# Patient Record
Sex: Female | Born: 1950 | Race: White | Hispanic: No | Smoking: Never smoker
Health system: Southern US, Community
[De-identification: ages and names within clinical notes are randomized; demographics above are authoritative.]

## PROBLEM LIST (undated history)

## (undated) ENCOUNTER — Ambulatory Visit (HOSPITAL_BASED_OUTPATIENT_CLINIC_OR_DEPARTMENT_OTHER)

## (undated) ENCOUNTER — Ambulatory Visit (HOSPITAL_BASED_OUTPATIENT_CLINIC_OR_DEPARTMENT_OTHER): Admission: EM | Source: Home / Self Care

## (undated) DIAGNOSIS — F419 Anxiety disorder, unspecified: Secondary | ICD-10-CM

## (undated) DIAGNOSIS — F329 Major depressive disorder, single episode, unspecified: Secondary | ICD-10-CM

## (undated) DIAGNOSIS — F32A Depression, unspecified: Secondary | ICD-10-CM

## (undated) DIAGNOSIS — R251 Tremor, unspecified: Secondary | ICD-10-CM

## (undated) DIAGNOSIS — G248 Other dystonia: Secondary | ICD-10-CM

## (undated) DIAGNOSIS — G43509 Persistent migraine aura without cerebral infarction, not intractable, without status migrainosus: Secondary | ICD-10-CM

## (undated) DIAGNOSIS — Z87820 Personal history of traumatic brain injury: Secondary | ICD-10-CM

## (undated) DIAGNOSIS — M810 Age-related osteoporosis without current pathological fracture: Secondary | ICD-10-CM

## (undated) DIAGNOSIS — I700 Atherosclerosis of aorta: Secondary | ICD-10-CM

## (undated) DIAGNOSIS — E559 Vitamin D deficiency, unspecified: Secondary | ICD-10-CM

## (undated) DIAGNOSIS — I82409 Acute embolism and thrombosis of unspecified deep veins of unspecified lower extremity: Secondary | ICD-10-CM

## (undated) DIAGNOSIS — T8859XA Other complications of anesthesia, initial encounter: Secondary | ICD-10-CM

## (undated) DIAGNOSIS — I639 Cerebral infarction, unspecified: Secondary | ICD-10-CM

## (undated) DIAGNOSIS — G20 Parkinson's disease: Secondary | ICD-10-CM

## (undated) DIAGNOSIS — T4145XA Adverse effect of unspecified anesthetic, initial encounter: Secondary | ICD-10-CM

## (undated) DIAGNOSIS — E785 Hyperlipidemia, unspecified: Secondary | ICD-10-CM

## (undated) DIAGNOSIS — I77810 Thoracic aortic ectasia: Secondary | ICD-10-CM

## (undated) DIAGNOSIS — G20A1 Parkinson's disease without dyskinesia, without mention of fluctuations: Secondary | ICD-10-CM

## (undated) DIAGNOSIS — K219 Gastro-esophageal reflux disease without esophagitis: Secondary | ICD-10-CM

## (undated) HISTORY — DX: Other dystonia: G24.8

## (undated) HISTORY — PX: NASAL SEPTUM SURGERY: SHX37

## (undated) HISTORY — PX: ESOPHAGEAL DILATION: SHX303

## (undated) HISTORY — PX: LAPAROSCOPIC CHOLECYSTECTOMY: SUR755

## (undated) HISTORY — DX: Age-related osteoporosis without current pathological fracture: M81.0

## (undated) HISTORY — PX: BACK SURGERY: SHX140

## (undated) HISTORY — DX: Vitamin D deficiency, unspecified: E55.9

## (undated) HISTORY — DX: Gastro-esophageal reflux disease without esophagitis: K21.9

## (undated) HISTORY — DX: Depression, unspecified: F32.A

## (undated) HISTORY — DX: Tremor, unspecified: R25.1

## (undated) HISTORY — PX: CARDIAC CATHETERIZATION: SHX172

## (undated) HISTORY — DX: Cerebral infarction, unspecified: I63.9

## (undated) HISTORY — DX: Atherosclerosis of aorta: I70.0

## (undated) HISTORY — DX: Thoracic aortic ectasia: I77.810

## (undated) HISTORY — PX: CATARACT EXTRACTION: SUR2

## (undated) HISTORY — DX: Personal history of traumatic brain injury: Z87.820

## (undated) HISTORY — DX: Major depressive disorder, single episode, unspecified: F32.9

## (undated) HISTORY — DX: Hyperlipidemia, unspecified: E78.5

## (undated) HISTORY — DX: Acute embolism and thrombosis of unspecified deep veins of unspecified lower extremity: I82.409

---

## 1986-04-29 HISTORY — PX: TUBAL LIGATION: SHX77

## 1992-10-19 ENCOUNTER — Encounter: Payer: Self-pay | Admitting: Internal Medicine

## 1992-12-20 ENCOUNTER — Encounter: Payer: Self-pay | Admitting: Internal Medicine

## 1993-04-29 HISTORY — PX: KNEE ARTHROSCOPY: SHX127

## 1997-07-21 ENCOUNTER — Other Ambulatory Visit: Admission: RE | Admit: 1997-07-21 | Discharge: 1997-07-21 | Payer: Self-pay | Admitting: Obstetrics and Gynecology

## 1998-07-27 ENCOUNTER — Other Ambulatory Visit: Admission: RE | Admit: 1998-07-27 | Discharge: 1998-07-27 | Payer: Self-pay | Admitting: Obstetrics and Gynecology

## 1998-08-09 ENCOUNTER — Other Ambulatory Visit: Admission: RE | Admit: 1998-08-09 | Discharge: 1998-08-09 | Payer: Self-pay | Admitting: Obstetrics and Gynecology

## 1998-10-18 ENCOUNTER — Other Ambulatory Visit: Admission: RE | Admit: 1998-10-18 | Discharge: 1998-10-18 | Payer: Self-pay | Admitting: Obstetrics and Gynecology

## 1999-04-02 ENCOUNTER — Other Ambulatory Visit: Admission: RE | Admit: 1999-04-02 | Discharge: 1999-04-02 | Payer: Self-pay | Admitting: Obstetrics and Gynecology

## 1999-04-03 ENCOUNTER — Other Ambulatory Visit: Admission: RE | Admit: 1999-04-03 | Discharge: 1999-04-03 | Payer: Self-pay | Admitting: Obstetrics and Gynecology

## 1999-04-03 ENCOUNTER — Encounter (INDEPENDENT_AMBULATORY_CARE_PROVIDER_SITE_OTHER): Payer: Self-pay | Admitting: Specialist

## 1999-04-19 ENCOUNTER — Encounter: Admission: RE | Admit: 1999-04-19 | Discharge: 1999-04-19 | Payer: Self-pay | Admitting: *Deleted

## 1999-04-19 ENCOUNTER — Encounter: Payer: Self-pay | Admitting: *Deleted

## 1999-04-25 ENCOUNTER — Encounter: Payer: Self-pay | Admitting: Neurosurgery

## 1999-04-26 ENCOUNTER — Inpatient Hospital Stay (HOSPITAL_COMMUNITY): Admission: RE | Admit: 1999-04-26 | Discharge: 1999-04-26 | Payer: Self-pay | Admitting: Neurosurgery

## 1999-04-26 ENCOUNTER — Encounter: Payer: Self-pay | Admitting: Neurosurgery

## 1999-05-17 ENCOUNTER — Encounter: Payer: Self-pay | Admitting: Neurosurgery

## 1999-05-17 ENCOUNTER — Encounter: Admission: RE | Admit: 1999-05-17 | Discharge: 1999-05-17 | Payer: Self-pay | Admitting: Neurosurgery

## 1999-05-24 ENCOUNTER — Encounter: Admission: RE | Admit: 1999-05-24 | Discharge: 1999-05-24 | Payer: Self-pay | Admitting: *Deleted

## 1999-05-24 ENCOUNTER — Encounter: Payer: Self-pay | Admitting: *Deleted

## 1999-05-29 ENCOUNTER — Encounter: Payer: Self-pay | Admitting: *Deleted

## 1999-05-29 ENCOUNTER — Encounter: Admission: RE | Admit: 1999-05-29 | Discharge: 1999-05-29 | Payer: Self-pay | Admitting: *Deleted

## 1999-07-17 ENCOUNTER — Encounter: Payer: Self-pay | Admitting: Neurology

## 1999-07-17 ENCOUNTER — Encounter: Admission: RE | Admit: 1999-07-17 | Discharge: 1999-07-17 | Payer: Self-pay | Admitting: Neurology

## 1999-07-24 ENCOUNTER — Encounter: Admission: RE | Admit: 1999-07-24 | Discharge: 1999-07-24 | Payer: Self-pay | Admitting: Neurosurgery

## 1999-07-24 ENCOUNTER — Encounter: Payer: Self-pay | Admitting: Neurosurgery

## 1999-08-15 ENCOUNTER — Encounter: Admission: RE | Admit: 1999-08-15 | Discharge: 1999-08-15 | Payer: Self-pay | Admitting: Obstetrics and Gynecology

## 1999-08-15 ENCOUNTER — Encounter: Payer: Self-pay | Admitting: Obstetrics and Gynecology

## 1999-10-02 ENCOUNTER — Other Ambulatory Visit: Admission: RE | Admit: 1999-10-02 | Discharge: 1999-10-02 | Payer: Self-pay | Admitting: Obstetrics and Gynecology

## 2000-01-22 ENCOUNTER — Encounter: Payer: Self-pay | Admitting: Neurosurgery

## 2000-01-22 ENCOUNTER — Encounter: Admission: RE | Admit: 2000-01-22 | Discharge: 2000-01-22 | Payer: Self-pay | Admitting: Neurosurgery

## 2000-07-01 ENCOUNTER — Encounter: Admission: RE | Admit: 2000-07-01 | Discharge: 2000-07-01 | Payer: Self-pay | Admitting: Neurosurgery

## 2000-07-01 ENCOUNTER — Encounter: Payer: Self-pay | Admitting: Neurosurgery

## 2000-08-14 ENCOUNTER — Encounter: Payer: Self-pay | Admitting: Neurosurgery

## 2000-08-14 ENCOUNTER — Encounter: Admission: RE | Admit: 2000-08-14 | Discharge: 2000-08-14 | Payer: Self-pay | Admitting: Neurosurgery

## 2000-10-29 ENCOUNTER — Other Ambulatory Visit: Admission: RE | Admit: 2000-10-29 | Discharge: 2000-10-29 | Payer: Self-pay | Admitting: Obstetrics and Gynecology

## 2000-11-19 ENCOUNTER — Encounter: Admission: RE | Admit: 2000-11-19 | Discharge: 2000-11-19 | Payer: Self-pay | Admitting: Obstetrics and Gynecology

## 2000-11-19 ENCOUNTER — Encounter: Payer: Self-pay | Admitting: Obstetrics and Gynecology

## 2001-03-29 HISTORY — PX: COMBINED HYSTEROSCOPY DIAGNOSTIC / D&C: SUR297

## 2001-04-20 ENCOUNTER — Ambulatory Visit (HOSPITAL_COMMUNITY): Admission: RE | Admit: 2001-04-20 | Discharge: 2001-04-20 | Payer: Self-pay | Admitting: Obstetrics and Gynecology

## 2001-04-20 ENCOUNTER — Encounter (INDEPENDENT_AMBULATORY_CARE_PROVIDER_SITE_OTHER): Payer: Self-pay

## 2001-05-27 ENCOUNTER — Encounter: Admission: RE | Admit: 2001-05-27 | Discharge: 2001-08-25 | Payer: Self-pay | Admitting: *Deleted

## 2001-11-23 ENCOUNTER — Encounter: Payer: Self-pay | Admitting: Obstetrics and Gynecology

## 2001-11-23 ENCOUNTER — Encounter: Admission: RE | Admit: 2001-11-23 | Discharge: 2001-11-23 | Payer: Self-pay | Admitting: Obstetrics and Gynecology

## 2001-11-25 ENCOUNTER — Other Ambulatory Visit: Admission: RE | Admit: 2001-11-25 | Discharge: 2001-11-25 | Payer: Self-pay | Admitting: Obstetrics and Gynecology

## 2002-07-09 ENCOUNTER — Encounter: Admission: RE | Admit: 2002-07-09 | Discharge: 2002-07-09 | Payer: Self-pay | Admitting: *Deleted

## 2002-07-09 ENCOUNTER — Encounter: Payer: Self-pay | Admitting: *Deleted

## 2002-10-29 ENCOUNTER — Encounter: Admission: RE | Admit: 2002-10-29 | Discharge: 2002-10-29 | Payer: Self-pay | Admitting: Emergency Medicine

## 2002-12-08 ENCOUNTER — Other Ambulatory Visit: Admission: RE | Admit: 2002-12-08 | Discharge: 2002-12-08 | Payer: Self-pay | Admitting: Obstetrics and Gynecology

## 2003-03-25 ENCOUNTER — Encounter: Admission: RE | Admit: 2003-03-25 | Discharge: 2003-03-25 | Payer: Self-pay | Admitting: Obstetrics and Gynecology

## 2003-06-30 ENCOUNTER — Other Ambulatory Visit: Admission: RE | Admit: 2003-06-30 | Discharge: 2003-06-30 | Payer: Self-pay | Admitting: Obstetrics and Gynecology

## 2004-01-05 ENCOUNTER — Other Ambulatory Visit: Admission: RE | Admit: 2004-01-05 | Discharge: 2004-01-05 | Payer: Self-pay | Admitting: Obstetrics and Gynecology

## 2004-02-02 ENCOUNTER — Encounter: Admission: RE | Admit: 2004-02-02 | Discharge: 2004-02-02 | Payer: Self-pay | Admitting: Endocrinology

## 2004-04-20 ENCOUNTER — Encounter: Admission: RE | Admit: 2004-04-20 | Discharge: 2004-04-20 | Payer: Self-pay | Admitting: Obstetrics and Gynecology

## 2004-04-29 HISTORY — PX: ANTERIOR CERVICAL DECOMP/DISCECTOMY FUSION: SHX1161

## 2005-02-01 ENCOUNTER — Other Ambulatory Visit: Admission: RE | Admit: 2005-02-01 | Discharge: 2005-02-01 | Payer: Self-pay | Admitting: Obstetrics and Gynecology

## 2005-04-26 ENCOUNTER — Encounter: Admission: RE | Admit: 2005-04-26 | Discharge: 2005-04-26 | Payer: Self-pay | Admitting: Obstetrics and Gynecology

## 2005-05-16 ENCOUNTER — Encounter: Admission: RE | Admit: 2005-05-16 | Discharge: 2005-05-16 | Payer: Self-pay | Admitting: Obstetrics and Gynecology

## 2005-12-12 ENCOUNTER — Encounter: Admission: RE | Admit: 2005-12-12 | Discharge: 2005-12-12 | Payer: Self-pay | Admitting: Obstetrics and Gynecology

## 2006-05-19 ENCOUNTER — Encounter: Admission: RE | Admit: 2006-05-19 | Discharge: 2006-05-19 | Payer: Self-pay | Admitting: *Deleted

## 2006-07-22 ENCOUNTER — Ambulatory Visit (HOSPITAL_COMMUNITY): Admission: RE | Admit: 2006-07-22 | Discharge: 2006-07-22 | Payer: Self-pay

## 2006-08-03 ENCOUNTER — Encounter: Admission: RE | Admit: 2006-08-03 | Discharge: 2006-08-03 | Payer: Self-pay

## 2006-08-14 ENCOUNTER — Encounter: Admission: RE | Admit: 2006-08-14 | Discharge: 2006-08-14 | Payer: Self-pay

## 2006-08-22 ENCOUNTER — Encounter: Admission: RE | Admit: 2006-08-22 | Discharge: 2006-08-22 | Payer: Self-pay | Admitting: Endocrinology

## 2007-03-31 ENCOUNTER — Encounter: Admission: RE | Admit: 2007-03-31 | Discharge: 2007-03-31 | Payer: Self-pay | Admitting: Internal Medicine

## 2007-04-13 ENCOUNTER — Encounter: Admission: RE | Admit: 2007-04-13 | Discharge: 2007-04-13 | Payer: Self-pay | Admitting: Internal Medicine

## 2007-06-17 ENCOUNTER — Encounter: Admission: RE | Admit: 2007-06-17 | Discharge: 2007-06-17 | Payer: Self-pay | Admitting: Obstetrics and Gynecology

## 2007-10-13 ENCOUNTER — Encounter: Payer: Self-pay | Admitting: Internal Medicine

## 2007-11-18 ENCOUNTER — Ambulatory Visit: Payer: Self-pay | Admitting: Internal Medicine

## 2007-11-18 DIAGNOSIS — I6350 Cerebral infarction due to unspecified occlusion or stenosis of unspecified cerebral artery: Secondary | ICD-10-CM | POA: Insufficient documentation

## 2007-11-18 DIAGNOSIS — G4730 Sleep apnea, unspecified: Secondary | ICD-10-CM | POA: Insufficient documentation

## 2007-11-18 DIAGNOSIS — R51 Headache: Secondary | ICD-10-CM | POA: Insufficient documentation

## 2007-11-18 DIAGNOSIS — E785 Hyperlipidemia, unspecified: Secondary | ICD-10-CM | POA: Insufficient documentation

## 2007-11-18 DIAGNOSIS — R519 Headache, unspecified: Secondary | ICD-10-CM | POA: Insufficient documentation

## 2007-11-25 ENCOUNTER — Telehealth (INDEPENDENT_AMBULATORY_CARE_PROVIDER_SITE_OTHER): Payer: Self-pay | Admitting: *Deleted

## 2007-12-01 ENCOUNTER — Encounter: Payer: Self-pay | Admitting: Internal Medicine

## 2007-12-02 ENCOUNTER — Ambulatory Visit: Payer: Self-pay | Admitting: Internal Medicine

## 2007-12-02 ENCOUNTER — Telehealth (INDEPENDENT_AMBULATORY_CARE_PROVIDER_SITE_OTHER): Payer: Self-pay | Admitting: *Deleted

## 2007-12-18 ENCOUNTER — Encounter: Payer: Self-pay | Admitting: Internal Medicine

## 2008-01-24 ENCOUNTER — Encounter: Admission: RE | Admit: 2008-01-24 | Discharge: 2008-01-24 | Payer: Self-pay | Admitting: Internal Medicine

## 2008-01-26 ENCOUNTER — Ambulatory Visit: Payer: Self-pay | Admitting: Internal Medicine

## 2008-02-06 DIAGNOSIS — J309 Allergic rhinitis, unspecified: Secondary | ICD-10-CM | POA: Insufficient documentation

## 2008-03-13 ENCOUNTER — Encounter: Payer: Self-pay | Admitting: Internal Medicine

## 2008-08-01 ENCOUNTER — Encounter: Admission: RE | Admit: 2008-08-01 | Discharge: 2008-08-01 | Payer: Self-pay | Admitting: Obstetrics and Gynecology

## 2009-01-06 ENCOUNTER — Encounter: Admission: RE | Admit: 2009-01-06 | Discharge: 2009-01-06 | Payer: Self-pay | Admitting: Obstetrics and Gynecology

## 2009-08-21 ENCOUNTER — Encounter: Admission: RE | Admit: 2009-08-21 | Discharge: 2009-08-21 | Payer: Self-pay | Admitting: Obstetrics and Gynecology

## 2010-05-19 ENCOUNTER — Encounter: Payer: Self-pay | Admitting: Obstetrics and Gynecology

## 2010-05-20 ENCOUNTER — Encounter: Payer: Self-pay | Admitting: Obstetrics and Gynecology

## 2010-05-20 ENCOUNTER — Encounter: Payer: Self-pay | Admitting: Endocrinology

## 2010-07-12 ENCOUNTER — Other Ambulatory Visit: Payer: Self-pay | Admitting: Obstetrics and Gynecology

## 2010-07-12 DIAGNOSIS — Z1231 Encounter for screening mammogram for malignant neoplasm of breast: Secondary | ICD-10-CM

## 2010-09-07 ENCOUNTER — Ambulatory Visit
Admission: RE | Admit: 2010-09-07 | Discharge: 2010-09-07 | Disposition: A | Discharge status: Home / Self Care | Payer: BC Managed Care – PPO | Source: Ambulatory Visit | Attending: Obstetrics and Gynecology | Admitting: Obstetrics and Gynecology

## 2010-09-07 DIAGNOSIS — Z1231 Encounter for screening mammogram for malignant neoplasm of breast: Secondary | ICD-10-CM

## 2010-09-14 NOTE — H&P (Signed)
[facility]  Patient:    Barbara Miranda Visit Number: [id number] MRN: [id number]          Service Type: Attending:  [name] Salvia. Marcelle Overlie, M.D. Dictated by:   [name] Salvia. Marcelle Overlie, M.D. Adm. Date:  04/20/01                           History and Physical  CHIEF COMPLAINT:  Menorrhagia, abnormal uterine bleeding.  HISTORY OF PRESENT ILLNESS:  A 60 year old G3, P3, previous tubal ligation. This patient has had a six-month history of menorrhagia and some irregular bleeding.  Ultrasound done in the office November 16, 2000, showed some irregular polypoid areas within the cavity on saline infusion.  She presents now for D&C/hysteroscopy.  This procedure, including risks of bleeding, infection, the possible need for open or additional surgery, are reviewed with her, which she understands and accepts.  ALLERGIES:  None.  PAST SURGICAL HISTORY:  Tubal ligation.  MEDICATIONS:  Adderall, calcium, vitamins, atenolol.  PHYSICAL EXAMINATION:  VITAL SIGNS:  Temperature 98.2, blood pressure 106/60.  HEENT:  Unremarkable.  NECK:  Supple without mass.  CHEST:  Lungs clear.  CARDIAC:  Regular rate and rhythm without murmurs, rubs, gallops noted.  BREASTS:  Without masses.  ABDOMEN:  Soft, flat, and nontender.  PELVIC:  Normal external genitalia.  Vagina and cervix clear.  Uterus midposition, normal size.  Adnexa negative.  IMPRESSION:  Abnormal uterine bleeding, menorrhagia.  Endometrial polyps noted on sonohysterogram.  PLAN:  D&C, hysteroscopy.  Procedure and risks reviewed as above. Dictated by:   [name] Salvia. Marcelle Overlie, M.D. Attending:  [name] Salvia. Marcelle Overlie, M.D. DD:  04/13/01 TD:  04/13/01 Job: [id number]

## 2010-09-14 NOTE — Op Note (Signed)
[facility]  Patient:    Barbara Miranda, Barbara Miranda Visit Number: [id number] MRN: [id number]          Service Type: DSU Location: [facility] Attending Physician:  Rhina Brackett Dictated by:   [name] Salvia. Marcelle Overlie, M.D. Proc. Date: 04/20/01 Admit Date:  04/20/2001                             Operative Report  PREOPERATIVE DIAGNOSES:       1. Abnormal uterine bleeding.                               2. Endometrial polyps.  POSTOPERATIVE DIAGNOSES:      1. Abnormal uterine bleeding.                               2. Endometrial polyps.  PROCEDURE:                    Hysteroscopy, dilation and curettage.  SURGEON:                      [name] Salvia. Marcelle Overlie, M.D.  ANESTHESIA:                   Sedation plus paracervical block.  PROCEDURE AND FINDINGS:       The patient was taken to the operating room. After an adequate level of sedation was obtained with the patients legs in stirrups, the perineum and vagina were prepped and draped in the usual manner for vaginal procedures.  The bladder was drained and EUA carried out.  The uterus was midposition, normal size and mobile.  Adnexa negative.  A speculum was positioned.  The cervix was grasped with a tenaculum.  Paracervical block was created by infiltrating at 3 and 9 oclock submucosally 5-7 cc of 1% Xylocaine on each side after negative aspiration.  After this was completed, the uterus was sounded to 10 cm and progressively dilated to #29 Shawnie Pons.  The continuous flow 7 mm diagnostic hysteroscope was then used to inspect the cavity.  What appeared to be two large polyps were noted.  The scope was removed.  D&C was carried out.  A large amount of tissue including some polypoid tissue was removed.  When the walls were felt to be clean, the scope was reinserted.  The cavity was irrigated and reinspected and noted to be normal.  There were no other remaining polyps or abnormalities.  She tolerated this well and went to  the recovery room in good condition. Dictated by:   [name] Salvia. Marcelle Overlie, M.D. Attending Physician:  Rhina Brackett DD:  04/20/01 TD:  04/20/01 Job: [id number]

## 2010-09-14 NOTE — Procedures (Signed)
EEG NUMBER:   EEG recording was performed as a routine study.  The patient was  ambulatory.  The patient is described as left-handed, awake and during  the EEG, partially drowsy.  Activating procedures for this 16 channel  EEG recording was 1 channel representing heart rate and rhythm  exclusively were including hyperventilation and photic stimulation.   Amantadine Adderall, Inderal, Zoloft, aspirin, and Midrin were named as  medications.  No allergies were noted.  The patient is a 60 year old  left handed individual with the with a history of temporal lobe  seizures.   DESCRIPTION:  A posterior dominant background of 8 Hz is seen emitting  symmetrically from the posterior hemispheres promptly attenuating with  eye opening.  During the recording, these posterior dominant rhythms  resume sharp wave formation.  These pointed spike-like discharges are  especially seen at the end of hyperventilation.  Also, a seizure was  clinically not witnessed.  The degree of amplitude buildup and the phase  reversal over both parietal hemispheres would indicate to me dysrhythmia  grade 1.  During photic stimulation, there was photic entrainment from 5  Hz frequencies onward.  Again, no clinical seizure resulted.  Also, the  patient is described as having a history of temporal lobe seizures.  I  found phase reversals repeatedly occurring over the parietal and not  necessarily the temporal lobe regions.  The patient reached drowsiness  after the completion of both provocation maneuvers but did not enter  sleep stage I.   CONCLUSION:  This is an abnormal EEG due to the phase reversal over the  parietal regions with both provocation maneuvers.  Hyperventilation as  well as photic stimulation indicate an underlying irritable focus.  The  EKG remained in normal sinus rhythm.      Melvyn Novas, M.D.  Electronically Signed     [id number]  D:  07/22/2006 15:37:48  T:  07/22/2006 16:58:41  Job #:   [id number]   cc:   Dr. Eulogio Bear

## 2011-01-10 ENCOUNTER — Other Ambulatory Visit: Payer: Self-pay | Admitting: Internal Medicine

## 2011-01-10 DIAGNOSIS — G43909 Migraine, unspecified, not intractable, without status migrainosus: Secondary | ICD-10-CM

## 2011-08-02 ENCOUNTER — Other Ambulatory Visit: Payer: Self-pay | Admitting: Obstetrics and Gynecology

## 2011-08-02 DIAGNOSIS — Z1231 Encounter for screening mammogram for malignant neoplasm of breast: Secondary | ICD-10-CM

## 2011-09-09 ENCOUNTER — Ambulatory Visit
Admission: RE | Admit: 2011-09-09 | Discharge: 2011-09-09 | Disposition: A | Discharge status: Home / Self Care | Payer: BC Managed Care – PPO | Source: Ambulatory Visit | Attending: Obstetrics and Gynecology | Admitting: Obstetrics and Gynecology

## 2011-09-09 ENCOUNTER — Ambulatory Visit: Payer: BC Managed Care – PPO

## 2011-09-09 DIAGNOSIS — Z1231 Encounter for screening mammogram for malignant neoplasm of breast: Secondary | ICD-10-CM

## 2012-08-04 ENCOUNTER — Other Ambulatory Visit: Payer: Self-pay

## 2012-08-04 DIAGNOSIS — Z1231 Encounter for screening mammogram for malignant neoplasm of breast: Secondary | ICD-10-CM

## 2012-09-09 ENCOUNTER — Ambulatory Visit
Admission: RE | Admit: 2012-09-09 | Discharge: 2012-09-09 | Disposition: A | Discharge status: Home / Self Care | Payer: BC Managed Care – PPO | Source: Ambulatory Visit

## 2012-09-09 DIAGNOSIS — Z1231 Encounter for screening mammogram for malignant neoplasm of breast: Secondary | ICD-10-CM

## 2013-01-09 DIAGNOSIS — R251 Tremor, unspecified: Secondary | ICD-10-CM | POA: Insufficient documentation

## 2013-02-01 ENCOUNTER — Other Ambulatory Visit: Payer: Self-pay | Admitting: Obstetrics and Gynecology

## 2013-02-01 DIAGNOSIS — N644 Mastodynia: Secondary | ICD-10-CM

## 2013-02-01 DIAGNOSIS — N6001 Solitary cyst of right breast: Secondary | ICD-10-CM

## 2013-02-04 ENCOUNTER — Ambulatory Visit
Admission: RE | Admit: 2013-02-04 | Discharge: 2013-02-04 | Disposition: A | Discharge status: Home / Self Care | Payer: BC Managed Care – PPO | Source: Ambulatory Visit | Attending: Obstetrics and Gynecology | Admitting: Obstetrics and Gynecology

## 2013-02-04 DIAGNOSIS — N644 Mastodynia: Secondary | ICD-10-CM

## 2013-02-04 DIAGNOSIS — N6001 Solitary cyst of right breast: Secondary | ICD-10-CM

## 2013-08-09 ENCOUNTER — Other Ambulatory Visit: Payer: Self-pay

## 2013-08-09 DIAGNOSIS — Z1231 Encounter for screening mammogram for malignant neoplasm of breast: Secondary | ICD-10-CM

## 2013-09-10 ENCOUNTER — Ambulatory Visit: Payer: BC Managed Care – PPO

## 2013-09-15 ENCOUNTER — Ambulatory Visit
Admission: RE | Admit: 2013-09-15 | Discharge: 2013-09-15 | Disposition: A | Discharge status: Home / Self Care | Payer: BC Managed Care – PPO | Source: Ambulatory Visit

## 2013-09-15 DIAGNOSIS — Z1231 Encounter for screening mammogram for malignant neoplasm of breast: Secondary | ICD-10-CM

## 2014-06-03 ENCOUNTER — Other Ambulatory Visit: Payer: Self-pay | Admitting: Obstetrics and Gynecology

## 2014-06-03 DIAGNOSIS — R234 Changes in skin texture: Secondary | ICD-10-CM

## 2014-06-10 ENCOUNTER — Ambulatory Visit
Admission: RE | Admit: 2014-06-10 | Discharge: 2014-06-10 | Disposition: A | Discharge status: Home / Self Care | Payer: BC Managed Care – PPO | Source: Ambulatory Visit | Attending: Obstetrics and Gynecology | Admitting: Obstetrics and Gynecology

## 2014-06-10 DIAGNOSIS — R234 Changes in skin texture: Secondary | ICD-10-CM

## 2014-07-07 DIAGNOSIS — G20 Parkinson's disease: Secondary | ICD-10-CM | POA: Insufficient documentation

## 2014-07-07 DIAGNOSIS — K224 Dyskinesia of esophagus: Secondary | ICD-10-CM | POA: Insufficient documentation

## 2014-09-05 ENCOUNTER — Other Ambulatory Visit: Payer: Self-pay

## 2014-09-05 DIAGNOSIS — Z1231 Encounter for screening mammogram for malignant neoplasm of breast: Secondary | ICD-10-CM

## 2014-09-19 ENCOUNTER — Ambulatory Visit
Admission: RE | Admit: 2014-09-19 | Discharge: 2014-09-19 | Disposition: A | Discharge status: Home / Self Care | Payer: BC Managed Care – PPO | Source: Ambulatory Visit

## 2014-09-19 DIAGNOSIS — Z1231 Encounter for screening mammogram for malignant neoplasm of breast: Secondary | ICD-10-CM

## 2014-09-21 ENCOUNTER — Other Ambulatory Visit: Payer: Self-pay | Admitting: Obstetrics and Gynecology

## 2014-09-22 LAB — CYTOLOGY - PAP

## 2014-10-25 ENCOUNTER — Other Ambulatory Visit: Payer: Self-pay | Admitting: Obstetrics and Gynecology

## 2014-12-13 ENCOUNTER — Telehealth: Payer: Self-pay | Admitting: Neurology

## 2014-12-13 ENCOUNTER — Ambulatory Visit (INDEPENDENT_AMBULATORY_CARE_PROVIDER_SITE_OTHER): Payer: BC Managed Care – PPO | Admitting: Neurology

## 2014-12-13 ENCOUNTER — Encounter: Payer: Self-pay | Admitting: Neurology

## 2014-12-13 VITALS — BP 134/90 | HR 66 | Ht 61.5 in | Wt 158.0 lb

## 2014-12-13 DIAGNOSIS — G20 Parkinson's disease: Secondary | ICD-10-CM | POA: Diagnosis not present

## 2014-12-13 MED ORDER — PRAMIPEXOLE DIHYDROCHLORIDE 0.125 MG PO TABS: ORAL_TABLET | ORAL | Status: DC

## 2014-12-13 MED ORDER — PRAMIPEXOLE DIHYDROCHLORIDE 0.5 MG PO TABS: 0.5000 mg | ORAL_TABLET | Freq: Three times a day (TID) | ORAL | Status: DC

## 2014-12-13 NOTE — Telephone Encounter (Signed)
Patient spoke with her daughter who confirmed that she was told by Dr [name] that she did have Parkinson's Disease, not just tremor. She wanted to make you aware. She will call with any other questions.

## 2014-12-13 NOTE — Telephone Encounter (Signed)
No problem.

## 2014-12-13 NOTE — Patient Instructions (Signed)
1. Continue Topamax.  2. Start mirapex (pramipexole) as follows:  0.125 mg - 1 tablet three times per day for a week, then 2 tablets three times per day for a week and then fill the 0.5 mg tablet and take that, 1 pill three times per day 3. [facility] [address] of [address] [place], [place], [place] [address] Meeting Location Address: [address] Support Group Leader 1 Name: Rochele Raring, [place] Support Group Leader 1 E-mail: [email or web address] Support Group Leader 1 Phone: [phone] Typical Meeting Time: 10:30 a.m. Frequency of Meetings: Monthly Status of Group: Active E-mail: 1st Tuesday

## 2014-12-13 NOTE — Progress Notes (Signed)
Note routed to Dr [name]. 

## 2014-12-13 NOTE — Telephone Encounter (Signed)
Pt called and said she had a question about her appointment with Dr Tat this morning/Dawn CB# [phone]
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
## 2014-12-20 ENCOUNTER — Other Ambulatory Visit: Payer: Self-pay | Admitting: Neurology

## 2015-01-19 ENCOUNTER — Emergency Department (HOSPITAL_COMMUNITY): Payer: BC Managed Care – PPO

## 2015-01-19 ENCOUNTER — Encounter (HOSPITAL_COMMUNITY): Payer: Self-pay

## 2015-01-19 ENCOUNTER — Inpatient Hospital Stay (HOSPITAL_COMMUNITY)
Admission: EM | Admit: 2015-01-19 | Discharge: 2015-01-24 | DRG: 493 | Disposition: A | Discharge status: Home Health | Payer: BC Managed Care – PPO | Attending: General Surgery | Admitting: General Surgery

## 2015-01-19 DIAGNOSIS — S8255XA Nondisplaced fracture of medial malleolus of left tibia, initial encounter for closed fracture: Principal | ICD-10-CM | POA: Diagnosis present

## 2015-01-19 DIAGNOSIS — E785 Hyperlipidemia, unspecified: Secondary | ICD-10-CM | POA: Diagnosis present

## 2015-01-19 DIAGNOSIS — Z6828 Body mass index (BMI) 28.0-28.9, adult: Secondary | ICD-10-CM

## 2015-01-19 DIAGNOSIS — Z8673 Personal history of transient ischemic attack (TIA), and cerebral infarction without residual deficits: Secondary | ICD-10-CM

## 2015-01-19 DIAGNOSIS — S2611XA Contusion of heart without hemopericardium, initial encounter: Secondary | ICD-10-CM | POA: Diagnosis present

## 2015-01-19 DIAGNOSIS — S023XXA Fracture of orbital floor, initial encounter for closed fracture: Secondary | ICD-10-CM | POA: Diagnosis present

## 2015-01-19 DIAGNOSIS — S02412A LeFort II fracture, initial encounter for closed fracture: Secondary | ICD-10-CM | POA: Diagnosis present

## 2015-01-19 DIAGNOSIS — F329 Major depressive disorder, single episode, unspecified: Secondary | ICD-10-CM | POA: Diagnosis present

## 2015-01-19 DIAGNOSIS — M25572 Pain in left ankle and joints of left foot: Secondary | ICD-10-CM | POA: Diagnosis present

## 2015-01-19 DIAGNOSIS — S8252XA Displaced fracture of medial malleolus of left tibia, initial encounter for closed fracture: Secondary | ICD-10-CM

## 2015-01-19 DIAGNOSIS — S2239XA Fracture of one rib, unspecified side, initial encounter for closed fracture: Secondary | ICD-10-CM | POA: Diagnosis present

## 2015-01-19 DIAGNOSIS — T148XXA Other injury of unspecified body region, initial encounter: Secondary | ICD-10-CM

## 2015-01-19 DIAGNOSIS — S02402A Zygomatic fracture, unspecified, initial encounter for closed fracture: Secondary | ICD-10-CM | POA: Diagnosis present

## 2015-01-19 DIAGNOSIS — T148 Other injury of unspecified body region: Secondary | ICD-10-CM | POA: Diagnosis present

## 2015-01-19 DIAGNOSIS — E669 Obesity, unspecified: Secondary | ICD-10-CM | POA: Diagnosis present

## 2015-01-19 DIAGNOSIS — S20219A Contusion of unspecified front wall of thorax, initial encounter: Secondary | ICD-10-CM | POA: Diagnosis present

## 2015-01-19 DIAGNOSIS — R55 Syncope and collapse: Secondary | ICD-10-CM

## 2015-01-19 DIAGNOSIS — F419 Anxiety disorder, unspecified: Secondary | ICD-10-CM | POA: Diagnosis present

## 2015-01-19 DIAGNOSIS — I444 Left anterior fascicular block: Secondary | ICD-10-CM | POA: Diagnosis not present

## 2015-01-19 DIAGNOSIS — R748 Abnormal levels of other serum enzymes: Secondary | ICD-10-CM | POA: Diagnosis not present

## 2015-01-19 DIAGNOSIS — E559 Vitamin D deficiency, unspecified: Secondary | ICD-10-CM | POA: Diagnosis present

## 2015-01-19 DIAGNOSIS — G20 Parkinson's disease: Secondary | ICD-10-CM | POA: Diagnosis present

## 2015-01-19 DIAGNOSIS — S022XXA Fracture of nasal bones, initial encounter for closed fracture: Secondary | ICD-10-CM | POA: Diagnosis present

## 2015-01-19 DIAGNOSIS — K219 Gastro-esophageal reflux disease without esophagitis: Secondary | ICD-10-CM | POA: Diagnosis present

## 2015-01-19 DIAGNOSIS — D62 Acute posthemorrhagic anemia: Secondary | ICD-10-CM | POA: Diagnosis present

## 2015-01-19 DIAGNOSIS — G4730 Sleep apnea, unspecified: Secondary | ICD-10-CM | POA: Diagnosis present

## 2015-01-19 DIAGNOSIS — S8253XA Displaced fracture of medial malleolus of unspecified tibia, initial encounter for closed fracture: Secondary | ICD-10-CM | POA: Diagnosis present

## 2015-01-19 DIAGNOSIS — T07XXXA Unspecified multiple injuries, initial encounter: Secondary | ICD-10-CM

## 2015-01-19 DIAGNOSIS — S0292XA Unspecified fracture of facial bones, initial encounter for closed fracture: Secondary | ICD-10-CM

## 2015-01-19 HISTORY — DX: Adverse effect of unspecified anesthetic, initial encounter: T41.45XA

## 2015-01-19 HISTORY — DX: Parkinson's disease without dyskinesia, without mention of fluctuations: G20.A1

## 2015-01-19 HISTORY — DX: Parkinson's disease: G20

## 2015-01-19 HISTORY — DX: Other complications of anesthesia, initial encounter: T88.59XA

## 2015-01-19 HISTORY — DX: Persistent migraine aura without cerebral infarction, not intractable, without status migrainosus: G43.509

## 2015-01-19 HISTORY — DX: Anxiety disorder, unspecified: F41.9

## 2015-01-19 LAB — CBC
HEMATOCRIT: 40.3 % (ref 36.0–46.0)
HEMOGLOBIN: 13.1 g/dL (ref 12.0–15.0)
MCH: 28.9 pg (ref 26.0–34.0)
MCHC: 32.5 g/dL (ref 30.0–36.0)
MCV: 88.8 fL (ref 78.0–100.0)
Platelets: 183 10*3/uL (ref 150–400)
RBC: 4.54 MIL/uL (ref 3.87–5.11)
RDW: 14.1 % (ref 11.5–15.5)
WBC: 11.2 10*3/uL — ABNORMAL HIGH (ref 4.0–10.5)

## 2015-01-19 LAB — COMPREHENSIVE METABOLIC PANEL
ALBUMIN: 3.7 g/dL (ref 3.5–5.0)
ALT: 105 U/L — ABNORMAL HIGH (ref 14–54)
ANION GAP: 10 (ref 5–15)
AST: 271 U/L — ABNORMAL HIGH (ref 15–41)
Alkaline Phosphatase: 68 U/L (ref 38–126)
BUN: 12 mg/dL (ref 6–20)
CHLORIDE: 108 mmol/L (ref 101–111)
CO2: 21 mmol/L — ABNORMAL LOW (ref 22–32)
Calcium: 9 mg/dL (ref 8.9–10.3)
Creatinine, Ser: 0.8 mg/dL (ref 0.44–1.00)
GFR calc Af Amer: >60 mL/min (ref >60)
GFR calc non Af Amer: >60 mL/min (ref >60)
GLUCOSE: 164 mg/dL — AB (ref 65–99)
POTASSIUM: 3.4 mmol/L — AB (ref 3.5–5.1)
Sodium: 139 mmol/L (ref 135–145)
TOTAL PROTEIN: 6.2 g/dL — AB (ref 6.5–8.1)
Total Bilirubin: 0.4 mg/dL (ref 0.3–1.2)

## 2015-01-19 LAB — I-STAT CHEM 8, ED
BUN: 14 mg/dL (ref 6–20)
CALCIUM ION: 1.18 mmol/L (ref 1.13–1.30)
CHLORIDE: 106 mmol/L (ref 101–111)
Creatinine, Ser: 0.7 mg/dL (ref 0.44–1.00)
Glucose, Bld: 159 mg/dL — ABNORMAL HIGH (ref 65–99)
HCT: 43 % (ref 36.0–46.0)
Hemoglobin: 14.6 g/dL (ref 12.0–15.0)
Potassium: 3.4 mmol/L — ABNORMAL LOW (ref 3.5–5.1)
SODIUM: 140 mmol/L (ref 135–145)
TCO2: 21 mmol/L (ref 0–100)

## 2015-01-19 LAB — SAMPLE TO BLOOD BANK

## 2015-01-19 LAB — I-STAT TROPONIN, ED: TROPONIN I, POC: 0.2 ng/mL — AB (ref 0.00–0.08)

## 2015-01-19 LAB — PROTIME-INR
INR: 1.13 (ref 0.00–1.49)
PROTHROMBIN TIME: 14.7 s (ref 11.6–15.2)

## 2015-01-19 LAB — ETHANOL: Alcohol, Ethyl (B): <5 mg/dL (ref <5)

## 2015-01-19 LAB — CDS SEROLOGY

## 2015-01-19 MED ORDER — SILVER NITRATE-POT NITRATE 75-25 % EX MISC
10.0000 | Freq: Once | CUTANEOUS | Status: DC | PRN
  Filled 2015-01-19: qty 10

## 2015-01-19 MED ORDER — MORPHINE SULFATE (PF) 4 MG/ML IV SOLN
4.0000 mg | Freq: Once | INTRAVENOUS | Status: AC
  Administered 2015-01-19: 4 mg via INTRAVENOUS

## 2015-01-19 MED ORDER — IOHEXOL 300 MG/ML  SOLN
100.0000 mL | Freq: Once | INTRAMUSCULAR | Status: AC | PRN
  Administered 2015-01-19: 100 mL via INTRAVENOUS

## 2015-01-19 MED ORDER — LIDOCAINE HCL 4 % EX SOLN: 0.0000 mL | Freq: Once | CUTANEOUS | Status: DC | PRN

## 2015-01-19 MED ORDER — LIDOCAINE-EPINEPHRINE 1 %-1:100000 IJ SOLN
0.0000 mL | Freq: Once | INTRAMUSCULAR | Status: DC | PRN
  Filled 2015-01-19: qty 30

## 2015-01-19 MED ORDER — ONDANSETRON HCL 4 MG/2ML IJ SOLN
4.0000 mg | Freq: Once | INTRAMUSCULAR | Status: AC
  Administered 2015-01-19: 4 mg via INTRAVENOUS
  Filled 2015-01-19: qty 2

## 2015-01-19 MED ORDER — OXYCODONE HCL 5 MG PO TABS
10.0000 mg | ORAL_TABLET | ORAL | Status: DC | PRN
  Administered 2015-01-20 (×4): 10 mg via ORAL
  Administered 2015-01-20: 5 mg via ORAL
  Administered 2015-01-21 – 2015-01-23 (×8): 10 mg via ORAL
  Filled 2015-01-19 (×13): qty 2

## 2015-01-19 MED ORDER — PANTOPRAZOLE SODIUM 40 MG PO TBEC
40.0000 mg | DELAYED_RELEASE_TABLET | Freq: Every day | ORAL | Status: DC
  Administered 2015-01-20 – 2015-01-24 (×5): 40 mg via ORAL
  Filled 2015-01-19 (×5): qty 1

## 2015-01-19 MED ORDER — OXYMETAZOLINE HCL 0.05 % NA SOLN: 1.0000 | Freq: Once | NASAL | Status: DC | PRN

## 2015-01-19 MED ORDER — FLUOXETINE HCL 20 MG PO CAPS
40.0000 mg | ORAL_CAPSULE | Freq: Every day | ORAL | Status: DC
  Administered 2015-01-20 – 2015-01-24 (×5): 40 mg via ORAL
  Filled 2015-01-19 (×7): qty 2

## 2015-01-19 MED ORDER — MORPHINE SULFATE (PF) 4 MG/ML IV SOLN
4.0000 mg | Freq: Once | INTRAVENOUS | Status: DC
  Filled 2015-01-19: qty 1

## 2015-01-19 MED ORDER — TRIPLE ANTIBIOTIC 3.5-400-5000 EX OINT
1.0000 "application " | TOPICAL_OINTMENT | Freq: Once | CUTANEOUS | Status: AC | PRN
  Administered 2015-01-19: 1 via CUTANEOUS
  Filled 2015-01-19 (×2): qty 1

## 2015-01-19 MED ORDER — ACETAMINOPHEN 325 MG PO TABS: 325.0000 mg | ORAL_TABLET | Freq: Four times a day (QID) | ORAL | Status: DC | PRN

## 2015-01-19 MED ORDER — OXYCODONE HCL 5 MG PO TABS
5.0000 mg | ORAL_TABLET | ORAL | Status: DC | PRN
  Administered 2015-01-19 – 2015-01-23 (×4): 5 mg via ORAL
  Filled 2015-01-19 (×5): qty 1

## 2015-01-19 MED ORDER — ONDANSETRON HCL 4 MG PO TABS: 4.0000 mg | ORAL_TABLET | Freq: Four times a day (QID) | ORAL | Status: DC | PRN

## 2015-01-19 MED ORDER — TOPIRAMATE 100 MG PO TABS
100.0000 mg | ORAL_TABLET | Freq: Every evening | ORAL | Status: DC
  Administered 2015-01-20 – 2015-01-23 (×5): 100 mg via ORAL
  Filled 2015-01-19 (×6): qty 1

## 2015-01-19 MED ORDER — MORPHINE SULFATE (PF) 2 MG/ML IV SOLN
2.0000 mg | INTRAVENOUS | Status: DC | PRN
  Administered 2015-01-23: 2 mg via INTRAVENOUS
  Filled 2015-01-19: qty 1

## 2015-01-19 MED ORDER — LIDOCAINE HCL 2 % EX GEL
1.0000 "application " | Freq: Once | CUTANEOUS | Status: DC | PRN
  Filled 2015-01-19: qty 5

## 2015-01-19 MED ORDER — ENOXAPARIN SODIUM 40 MG/0.4ML ~~LOC~~ SOLN
40.0000 mg | SUBCUTANEOUS | Status: DC
  Administered 2015-01-20 – 2015-01-24 (×5): 40 mg via SUBCUTANEOUS
  Filled 2015-01-19 (×4): qty 0.4

## 2015-01-19 MED ORDER — TOPIRAMATE 25 MG PO TABS
50.0000 mg | ORAL_TABLET | Freq: Every morning | ORAL | Status: DC
  Administered 2015-01-20 – 2015-01-24 (×5): 50 mg via ORAL
  Filled 2015-01-19 (×5): qty 2

## 2015-01-19 MED ORDER — ONDANSETRON HCL 4 MG/2ML IJ SOLN: 4.0000 mg | Freq: Four times a day (QID) | INTRAMUSCULAR | Status: DC | PRN

## 2015-01-19 MED ORDER — SODIUM CHLORIDE 0.9 % IV BOLUS (SEPSIS)
1000.0000 mL | Freq: Once | INTRAVENOUS | Status: AC
  Administered 2015-01-19: 1000 mL via INTRAVENOUS

## 2015-01-19 MED ORDER — POTASSIUM CHLORIDE IN NACL 20-0.9 MEQ/L-% IV SOLN
INTRAVENOUS | Status: DC
  Administered 2015-01-19: 19:00:00 via INTRAVENOUS
  Filled 2015-01-19 (×2): qty 1000
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
## 2015-01-19 NOTE — Consult Note (Signed)
Patient will be fully evaluated tomorrow for her left medial malleolus fracture.  Splint/ Cam boot has been ordered.  Syncopal work up is in progress.  If cleared by Monday would anticipate repair at that time. Full consult to follow in the am.  Myrene Galas, MD [facility] [phone] (p)

## 2015-01-19 NOTE — ED Notes (Signed)
PA made aware of pt blood pressures.
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
## 2015-01-19 NOTE — Progress Notes (Signed)
Orthopedic Tech Progress Note Patient Details:  Barbara Miranda 07-May-1950 [id number]  Ortho Devices Type of Ortho Device: CAM walker Ortho Device/Splint Location: lle Ortho Device/Splint Interventions: Application   Crawford, Rembert 01/19/2015, 4:02 PM
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
## 2015-01-20 ENCOUNTER — Inpatient Hospital Stay (HOSPITAL_COMMUNITY): Payer: BC Managed Care – PPO

## 2015-01-20 ENCOUNTER — Encounter (HOSPITAL_COMMUNITY): Admission: EM | Disposition: A | Discharge status: Home Health | Payer: Self-pay | Source: Home / Self Care

## 2015-01-20 ENCOUNTER — Encounter (HOSPITAL_COMMUNITY): Payer: Self-pay | Admitting: Nurse Practitioner

## 2015-01-20 DIAGNOSIS — R55 Syncope and collapse: Secondary | ICD-10-CM

## 2015-01-20 DIAGNOSIS — D62 Acute posthemorrhagic anemia: Secondary | ICD-10-CM | POA: Diagnosis not present

## 2015-01-20 DIAGNOSIS — S0292XA Unspecified fracture of facial bones, initial encounter for closed fracture: Secondary | ICD-10-CM | POA: Diagnosis present

## 2015-01-20 DIAGNOSIS — T07XXXA Unspecified multiple injuries, initial encounter: Secondary | ICD-10-CM

## 2015-01-20 HISTORY — PX: EP IMPLANTABLE DEVICE: SHX172B

## 2015-01-20 LAB — BASIC METABOLIC PANEL
ANION GAP: 3 — AB (ref 5–15)
BUN: 13 mg/dL (ref 6–20)
CALCIUM: 8.6 mg/dL — AB (ref 8.9–10.3)
CHLORIDE: 107 mmol/L (ref 101–111)
CO2: 26 mmol/L (ref 22–32)
Creatinine, Ser: 0.79 mg/dL (ref 0.44–1.00)
GFR calc non Af Amer: >60 mL/min (ref >60)
Glucose, Bld: 124 mg/dL — ABNORMAL HIGH (ref 65–99)
Potassium: 4.5 mmol/L (ref 3.5–5.1)
Sodium: 136 mmol/L (ref 135–145)

## 2015-01-20 LAB — CBC
HEMATOCRIT: 36.3 % (ref 36.0–46.0)
HEMOGLOBIN: 11.9 g/dL — AB (ref 12.0–15.0)
MCH: 29.4 pg (ref 26.0–34.0)
MCHC: 32.8 g/dL (ref 30.0–36.0)
MCV: 89.6 fL (ref 78.0–100.0)
Platelets: 151 10*3/uL (ref 150–400)
RBC: 4.05 MIL/uL (ref 3.87–5.11)
RDW: 14.3 % (ref 11.5–15.5)
WBC: 8.8 10*3/uL (ref 4.0–10.5)

## 2015-01-20 LAB — SURGICAL PCR SCREEN
MRSA, PCR: NEGATIVE
Staphylococcus aureus: NEGATIVE

## 2015-01-20 LAB — TROPONIN I: Troponin I: 0.07 ng/mL — ABNORMAL HIGH (ref <0.031)

## 2015-01-20 SURGERY — LOOP RECORDER INSERTION
Anesthesia: LOCAL

## 2015-01-20 MED ORDER — SODIUM CHLORIDE 0.9 % IV SOLN
Freq: Once | INTRAVENOUS | Status: AC
  Filled 2015-01-20 (×2): qty 10

## 2015-01-20 MED ORDER — ACETAMINOPHEN 325 MG PO TABS
325.0000 mg | ORAL_TABLET | ORAL | Status: DC | PRN
  Administered 2015-01-24: 650 mg via ORAL
  Filled 2015-01-20: qty 2

## 2015-01-20 MED ORDER — ONDANSETRON HCL 4 MG/2ML IJ SOLN
4.0000 mg | Freq: Four times a day (QID) | INTRAMUSCULAR | Status: DC | PRN
  Administered 2015-01-22: 4 mg via INTRAVENOUS
  Filled 2015-01-20: qty 2

## 2015-01-20 MED ORDER — LIDOCAINE-EPINEPHRINE 1 %-1:100000 IJ SOLN
INTRAMUSCULAR | Status: AC
  Filled 2015-01-20: qty 1

## 2015-01-20 MED ORDER — LIDOCAINE-EPINEPHRINE 1 %-1:100000 IJ SOLN
INTRAMUSCULAR | Status: DC | PRN
Start: 2015-01-20 — End: 2015-01-20
  Administered 2015-01-20: 20 mL via INTRADERMAL

## 2015-01-20 SURGICAL SUPPLY — 2 items
LOOP REVEAL LINQSYS (Prosthesis & Implant Heart) ×2 IMPLANT
PACK LOOP INSERTION (CUSTOM PROCEDURE TRAY) ×2 IMPLANT
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
## 2015-01-20 NOTE — Progress Notes (Signed)
  Echocardiogram 2D Echocardiogram has been performed.  Delcie Roch 01/20/2015, 3:02 PM
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
## 2015-01-20 NOTE — Progress Notes (Signed)
OT Cancellation Note  Patient Details Name: Barbara Miranda MRN: [id number] DOB: Oct 04, 1950   Cancelled Treatment:    Reason Eval/Treat Not Completed: Patient not medically ready (tropinon levels elevated) Per latest Cardiology note recommending implantable loop recorder, echocardiography and serial cardiac enzymes.   Felecia Shelling   OTR/L Pager: [phone] Office: [phone] .  01/20/2015, 11:42 AM
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
## 2015-01-20 NOTE — Progress Notes (Signed)
PT Cancellation Note  Patient Details Name: Dominique Ressel MRN: [id number] DOB: 02-25-1951   Cancelled Treatment:    Reason Eval/Treat Not Completed: Medical issues which prohibited therapy (Elevated Troponin (.07) w/ unknown etiology).  Per latest Cardiology note recommending implantable loop recorder, echocardiography and serial cardiac enzymes.  PT will continue to follow acutely and will complete evaluation once appropriate.  Thank you for this order.  Michail Jewels PT, DPT [phone] Pager: [phone] 01/20/2015, 10:44 AM
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
## 2015-01-21 ENCOUNTER — Encounter (HOSPITAL_COMMUNITY): Payer: Self-pay | Admitting: Certified Registered Nurse Anesthetist

## 2015-01-21 ENCOUNTER — Inpatient Hospital Stay (HOSPITAL_COMMUNITY): Payer: BC Managed Care – PPO

## 2015-01-21 ENCOUNTER — Inpatient Hospital Stay (HOSPITAL_COMMUNITY): Payer: BC Managed Care – PPO | Admitting: Certified Registered Nurse Anesthetist

## 2015-01-21 ENCOUNTER — Encounter (HOSPITAL_COMMUNITY): Admission: EM | Disposition: A | Discharge status: Home Health | Payer: Self-pay | Source: Home / Self Care

## 2015-01-21 HISTORY — PX: ORIF ANKLE FRACTURE: SHX5408

## 2015-01-21 SURGERY — OPEN REDUCTION INTERNAL FIXATION (ORIF) ANKLE FRACTURE
Anesthesia: Monitor Anesthesia Care | Laterality: Left

## 2015-01-21 MED ORDER — MEPIVACAINE HCL 1.5 % IJ SOLN
INTRAMUSCULAR | Status: DC | PRN
  Administered 2015-01-21: 20 mL via PERINEURAL

## 2015-01-21 MED ORDER — MEPERIDINE HCL 25 MG/ML IJ SOLN: 6.2500 mg | INTRAMUSCULAR | Status: DC | PRN

## 2015-01-21 MED ORDER — BUPIVACAINE-EPINEPHRINE (PF) 0.5% -1:200000 IJ SOLN
INTRAMUSCULAR | Status: DC | PRN
  Administered 2015-01-21: 40 mL via PERINEURAL

## 2015-01-21 MED ORDER — CEFAZOLIN SODIUM-DEXTROSE 2-3 GM-% IV SOLR
INTRAVENOUS | Status: AC
  Filled 2015-01-21: qty 50

## 2015-01-21 MED ORDER — FENTANYL CITRATE (PF) 250 MCG/5ML IJ SOLN
INTRAMUSCULAR | Status: AC
  Filled 2015-01-21: qty 5

## 2015-01-21 MED ORDER — FENTANYL CITRATE (PF) 100 MCG/2ML IJ SOLN
INTRAMUSCULAR | Status: DC | PRN
  Administered 2015-01-21: 50 ug via INTRAVENOUS

## 2015-01-21 MED ORDER — PHENYLEPHRINE 40 MCG/ML (10ML) SYRINGE FOR IV PUSH (FOR BLOOD PRESSURE SUPPORT)
PREFILLED_SYRINGE | INTRAVENOUS | Status: AC
  Filled 2015-01-21: qty 10

## 2015-01-21 MED ORDER — LACTATED RINGERS IV SOLN
INTRAVENOUS | Status: DC | PRN
  Administered 2015-01-21: 12:00:00 via INTRAVENOUS

## 2015-01-21 MED ORDER — PHENYLEPHRINE HCL 10 MG/ML IJ SOLN
INTRAMUSCULAR | Status: DC | PRN
  Administered 2015-01-21: 80 ug via INTRAVENOUS
  Administered 2015-01-21 (×2): 40 ug via INTRAVENOUS

## 2015-01-21 MED ORDER — WHITE PETROLATUM GEL
Status: AC
  Administered 2015-01-21: 0.2
  Filled 2015-01-21: qty 1

## 2015-01-21 MED ORDER — CEFAZOLIN SODIUM-DEXTROSE 2-3 GM-% IV SOLR
INTRAVENOUS | Status: DC | PRN
  Administered 2015-01-21: 2 g via INTRAVENOUS

## 2015-01-21 MED ORDER — PROMETHAZINE HCL 25 MG/ML IJ SOLN: 6.2500 mg | INTRAMUSCULAR | Status: DC | PRN

## 2015-01-21 MED ORDER — HYDROMORPHONE HCL 1 MG/ML IJ SOLN: 0.2500 mg | INTRAMUSCULAR | Status: DC | PRN

## 2015-01-21 MED ORDER — PROPOFOL 500 MG/50ML IV EMUL
INTRAVENOUS | Status: DC | PRN
  Administered 2015-01-21: 50 ug/kg/min via INTRAVENOUS

## 2015-01-21 MED ORDER — ONDANSETRON HCL 4 MG/2ML IJ SOLN
INTRAMUSCULAR | Status: DC | PRN
Start: 2015-01-21 — End: 2015-01-21
  Administered 2015-01-21: 4 mg via INTRAVENOUS

## 2015-01-21 SURGICAL SUPPLY — 61 items
BANDAGE ELASTIC 4 VELCRO ST LF (GAUZE/BANDAGES/DRESSINGS) ×2 IMPLANT
BANDAGE ELASTIC 6 VELCRO ST LF (GAUZE/BANDAGES/DRESSINGS) IMPLANT
BANDAGE ESMARK 6X9 LF (GAUZE/BANDAGES/DRESSINGS) ×1 IMPLANT
BIT DRILL 27 CANN QC (BIT) ×2 IMPLANT
BLADE SURG 10 STRL SS (BLADE) ×2 IMPLANT
BNDG COHESIVE 4X5 TAN STRL (GAUZE/BANDAGES/DRESSINGS) ×2 IMPLANT
BNDG ESMARK 6X9 LF (GAUZE/BANDAGES/DRESSINGS) ×2
BNDG GAUZE ELAST 4 BULKY (GAUZE/BANDAGES/DRESSINGS) ×2 IMPLANT
BRUSH SCRUB DISP (MISCELLANEOUS) ×4 IMPLANT
COVER SURGICAL LIGHT HANDLE (MISCELLANEOUS) ×4 IMPLANT
DRAPE C-ARM 42X72 X-RAY (DRAPES) IMPLANT
DRAPE C-ARMOR (DRAPES) ×4 IMPLANT
DRAPE ORTHO SPLIT 77X108 STRL (DRAPES) ×3
DRAPE PROXIMA HALF (DRAPES) ×2 IMPLANT
DRAPE SURG ORHT 6 SPLT 77X108 (DRAPES) ×3 IMPLANT
DRAPE U-SHAPE 47X51 STRL (DRAPES) ×2 IMPLANT
DRSG EMULSION OIL 3X3 NADH (GAUZE/BANDAGES/DRESSINGS) ×2 IMPLANT
ELECT REM PT RETURN 9FT ADLT (ELECTROSURGICAL) ×2
ELECTRODE REM PT RTRN 9FT ADLT (ELECTROSURGICAL) ×1 IMPLANT
GAUZE SPONGE 4X4 12PLY STRL (GAUZE/BANDAGES/DRESSINGS) ×2 IMPLANT
GLOVE BIO SURGEON STRL SZ7.5 (GLOVE) ×2 IMPLANT
GLOVE BIO SURGEON STRL SZ8 (GLOVE) ×2 IMPLANT
GLOVE BIOGEL PI IND STRL 7.5 (GLOVE) ×1 IMPLANT
GLOVE BIOGEL PI IND STRL 8 (GLOVE) ×1 IMPLANT
GLOVE BIOGEL PI INDICATOR 7.5 (GLOVE) ×1
GLOVE BIOGEL PI INDICATOR 8 (GLOVE) ×1
GOWN STRL REUS W/ TWL LRG LVL3 (GOWN DISPOSABLE) ×2 IMPLANT
GOWN STRL REUS W/ TWL XL LVL3 (GOWN DISPOSABLE) ×1 IMPLANT
GOWN STRL REUS W/TWL LRG LVL3 (GOWN DISPOSABLE) ×2
GOWN STRL REUS W/TWL XL LVL3 (GOWN DISPOSABLE) ×1
GUIDEWIRE ORTHO 1.3X150 NT (WIRE) ×4 IMPLANT
KIT BASIN OR (CUSTOM PROCEDURE TRAY) ×2 IMPLANT
KIT ROOM TURNOVER OR (KITS) ×2 IMPLANT
MANIFOLD NEPTUNE II (INSTRUMENTS) ×2 IMPLANT
NEEDLE HYPO 21X1.5 SAFETY (NEEDLE) IMPLANT
NS IRRIG 1000ML POUR BTL (IV SOLUTION) ×2 IMPLANT
PACK GENERAL/GYN (CUSTOM PROCEDURE TRAY) ×2 IMPLANT
PAD ARMBOARD 7.5X6 YLW CONV (MISCELLANEOUS) ×4 IMPLANT
PAD CAST 4YDX4 CTTN HI CHSV (CAST SUPPLIES) IMPLANT
PADDING CAST COTTON 4X4 STRL (CAST SUPPLIES)
PADDING CAST COTTON 6X4 STRL (CAST SUPPLIES) IMPLANT
PENCIL BUTTON HOLSTER BLD 10FT (ELECTRODE) ×2 IMPLANT
SCREW CANNLT42X4 NS (Screw) ×1 IMPLANT
SCREW CANNULATED 4.0X36MM (Screw) ×2 IMPLANT
SCREW CANNULATED 4.0X42MM (Screw) ×1 IMPLANT
SPONGE LAP 18X18 X RAY DECT (DISPOSABLE) ×6 IMPLANT
SPONGE SCRUB IODOPHOR (GAUZE/BANDAGES/DRESSINGS) ×2 IMPLANT
STAPLER VISISTAT 35W (STAPLE) IMPLANT
SUCTION FRAZIER TIP 10 FR DISP (SUCTIONS) ×2 IMPLANT
SUT ETHILON 2 0 FS 18 (SUTURE) ×6 IMPLANT
SUT ETHILON 3 0 PS 1 (SUTURE) ×4 IMPLANT
SUT PDS AB 2-0 CT1 27 (SUTURE) IMPLANT
SUT VIC AB 2-0 CT1 27 (SUTURE) ×2
SUT VIC AB 2-0 CT1 TAPERPNT 27 (SUTURE) ×2 IMPLANT
SUT VIC AB 2-0 CT3 27 (SUTURE) IMPLANT
SYR CONTROL 10ML LL (SYRINGE) IMPLANT
TOWEL OR 17X24 6PK STRL BLUE (TOWEL DISPOSABLE) ×2 IMPLANT
TOWEL OR 17X26 10 PK STRL BLUE (TOWEL DISPOSABLE) ×4 IMPLANT
TUBE CONNECTING 12X1/4 (SUCTIONS) ×2 IMPLANT
UNDERPAD 30X30 INCONTINENT (UNDERPADS AND DIAPERS) ×2 IMPLANT
WATER STERILE IRR 1000ML POUR (IV SOLUTION) ×2 IMPLANT

## 2015-01-21 NOTE — Progress Notes (Signed)
PT Cancellation Note  Patient Details Name: Barbara Miranda MRN: [id number] DOB: Aug 22, 1950   Cancelled Treatment:    Reason Eval/Treat Not Completed: Other (comment) (Pt. going to OR this am for surgery)   Ferman Hamming 01/21/2015, 10:18 AM Weldon Picking PT [facility] Re[facility] [phone] Bee[place] [phone]

## 2015-01-21 NOTE — Progress Notes (Signed)
Sat dropping to 85-90 range this afternoon. Instructed pt and daughter on IS again and the importance of using every hour. Also instructed using pillow splint when she coughs. NWB post op, will use BSC, placed extension on 02 so it will reach.

## 2015-01-21 NOTE — Brief Op Note (Signed)
01/19/2015 - 01/21/2015  1:54 PM  PATIENT:  Barbara Miranda  64 y.o. female  PRE-OPERATIVE DIAGNOSIS:  LEFT MEDIAL MALLEOLUS FRACTURE  POST-OPERATIVE DIAGNOSIS:  LEFT MEDIAL MALLEOLUS FRACTURE  PROCEDURE:  Procedure(s): OPEN REDUCTION INTERNAL FIXATION (ORIF) MEDIAL MALLEOLUS FRACTURE (Left)  SURGEON:  Surgeon(s) and Role:    * Myrene Galas, MD - Primary  PHYSICIAN ASSISTANT: None  ANESTHESIA:   regional  I/O:  Total I/O In: 600 [I.V.:600] Out: 470 [Urine:450; Blood:scant]  SPECIMEN:  No Specimen  TOURNIQUET:  * No tourniquets in log *  DICTATION: .Other Dictation: Dictation Number [id number]

## 2015-01-21 NOTE — Anesthesia Preprocedure Evaluation (Addendum)
Anesthesia Evaluation  Patient identified by MRN, date of birth, ID band Patient awake    Reviewed: Allergy & Precautions, NPO status , Patient's Chart, lab work & pertinent test results  Airway Mallampati: II  TM Distance: >3 FB Neck ROM: Full    Dental no notable dental hx. (+) Dental Advisory Given   Pulmonary neg pulmonary ROS, sleep apnea ,    Pulmonary exam normal breath sounds clear to auscultation       Cardiovascular + Peripheral Vascular Disease  Normal cardiovascular exam Rhythm:Regular Rate:Normal     Neuro/Psych  Headaches, PSYCHIATRIC DISORDERS Anxiety Depression CVA negative psych ROS   GI/Hepatic Neg liver ROS, GERD  Medicated,  Endo/Other  negative endocrine ROS  Renal/GU negative Renal ROS     Musculoskeletal negative musculoskeletal ROS (+)   Abdominal   Peds  Hematology  (+) Blood dyscrasia, anemia ,   Anesthesia Other Findings   Reproductive/Obstetrics negative OB ROS                            Anesthesia Physical Anesthesia Plan  ASA: III  Anesthesia Plan: MAC and Regional   Post-op Pain Management:    Induction:   Airway Management Planned:   Additional Equipment:   Intra-op Plan:   Post-operative Plan:   Informed Consent: I have reviewed the patients History and Physical, chart, labs and discussed the procedure including the risks, benefits and alternatives for the proposed anesthesia with the patient or authorized representative who has indicated his/her understanding and acceptance.   Dental advisory given  Plan Discussed with: CRNA and Anesthesiologist  Anesthesia Plan Comments:       Anesthesia Quick Evaluation

## 2015-01-21 NOTE — Anesthesia Postprocedure Evaluation (Signed)
Anesthesia Post Note  Patient: Barbara Miranda  Procedure(s) Performed: Procedure(s) (LRB): OPEN REDUCTION INTERNAL FIXATION (ORIF) MEDIAL MALLEOLUS FRACTURE (Left)  Anesthesia type: MAC+AC/Pop  Patient location: PACU  Post pain: Pain level controlled  Post assessment: Post-op Vital signs reviewed  Last Vitals: BP 118/62 mmHg  Pulse 116  Temp(Src) 36.3 C (Oral)  Resp 18  Ht  (1.549 m)  Wt 151 lb (68.493 kg)  BMI 28.55 kg/m2  SpO2 93%  Post vital signs: Reviewed  Level of consciousness: awake  Complications: No apparent anesthesia complications

## 2015-01-21 NOTE — Progress Notes (Addendum)
No new questions today.  Conferred with other daughter.  PLAN for ORIF of left medial malleolus and probable placement back into CAM. Can be done under regional anesthesia. Consent and leg have been signed.  Risks have been discussed. OK per ortho to d/c as early as this pm but cardiac and optho issues under eval.  Myrene Galas, MD [facility] [phone] (p)
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
## 2015-01-21 NOTE — Transfer of Care (Signed)
Immediate Anesthesia Transfer of Care Note  Patient: Barbara Miranda  Procedure(s) Performed: Procedure(s): OPEN REDUCTION INTERNAL FIXATION (ORIF) MEDIAL MALLEOLUS FRACTURE (Left)  Patient Location: PACU  Anesthesia Type:MAC and Regional  Level of Consciousness: awake, alert  and oriented  Airway & Oxygen Therapy: Patient Spontanous Breathing and Patient connected to nasal cannula oxygen  Post-op Assessment: Report given to RN, Post -op Vital signs reviewed and stable and Patient moving all extremities X 4  Post vital signs: Reviewed and stable  Last Vitals:  Filed Vitals:   01/21/15 1109  BP: 115/63  Pulse: 97  Temp: 36.8 C  Resp: 18    Complications: No apparent anesthesia complications
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
## 2015-01-22 NOTE — Progress Notes (Signed)
Orthopaedic Trauma Service (OTS)  Subjective: 1 Day Post-Op Procedure(s) (LRB): OPEN REDUCTION INTERNAL FIXATION (ORIF) MEDIAL MALLEOLUS FRACTURE (Left) Patient reports pain as mild.   Feeling well and eager for d/c.  Objective: Current Vitals Blood pressure 130/65, pulse 94, temperature 98.1 F (36.7 C), temperature source Oral, resp. rate 17, height  (1.549 m), weight 151 lb (68.493 kg), SpO2 93 %. Vital signs in last 24 hours: Temp:  [97.4 F (36.3 C)-99.6 F (37.6 C)] 98.1 F (36.7 C) (09/25 0650) Pulse Rate:  [94-132] 94 (09/25 0650) Resp:  [17-21] 17 (09/25 0650) BP: (102-151)/(54-89) 130/65 mmHg (09/25 0650) SpO2:  [90 %-95 %] 93 % (09/25 0650)  Intake/Output from previous day: 09/24 0701 - 09/25 0700 In: 1400 [P.O.:400; I.V.:1000] Out: 1070 [Urine:1050; Blood:20]   Physical Exam  LLE Dressing intact, clean, dry  Edema/ swelling controlled  Sens: DPN, SPN, TN intact  Motor: EHL, FHL, and lessor toe ext and flex all intact grossly  Brisk cap refill, warm to touch   Imaging Dg Ankle Complete Left  01/21/2015   CLINICAL DATA:  Patient status post ORIF left distal tibia fracture.  EXAM: DG C-ARM 61-120 MIN; LEFT ANKLE COMPLETE - 3+ VIEW  COMPARISON:  Radiograph 01/19/2015  FINDINGS: 3 intraoperative fluoroscopic images were submitted. Patient status post screw fixation of medial malleolus fracture.  IMPRESSION: ORIF medial malleolus fracture.   Electronically Signed   By: Annia Belt M.D.   On: 01/21/2015 14:07   Dg C-arm 1-60 Min  01/21/2015   CLINICAL DATA:  Patient status post ORIF left distal tibia fracture.  EXAM: DG C-ARM 61-120 MIN; LEFT ANKLE COMPLETE - 3+ VIEW  COMPARISON:  Radiograph 01/19/2015  FINDINGS: 3 intraoperative fluoroscopic images were submitted. Patient status post screw fixation of medial malleolus fracture.  IMPRESSION: ORIF medial malleolus fracture.   Electronically Signed   By: Annia Belt M.D.   On: 01/21/2015 14:07     Assessment/Plan: 1 Day Post-Op Procedure(s) (LRB): OPEN REDUCTION INTERNAL FIXATION (ORIF) MEDIAL MALLEOLUS FRACTURE (Left)  1. TDWB on LLE in the CAM boot 2. F/u 10-14 days for removal of sutures and initiation of motion; WB in boot anticipated at 4 wks  Myrene Galas, MD [facility] [phone] (p)   01/22/2015, 12:08 PM
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
## 2015-01-22 NOTE — Progress Notes (Signed)
1 Day Post-Op  Subjective: No complaints. She has not noticed any more floaters  Objective: Vital signs in last 24 hours: Temp:  [97.4 F (36.3 C)-99.6 F (37.6 C)] 98.1 F (36.7 C) (09/25 0650) Pulse Rate:  [94-132] 94 (09/25 0650) Resp:  [17-21] 17 (09/25 0650) BP: (102-151)/(54-89) 130/65 mmHg (09/25 0650) SpO2:  [90 %-95 %] 93 % (09/25 0650) Last BM Date: 01/19/15  Intake/Output from previous day: 09/24 0701 - 09/25 0700 In: 1400 [P.O.:400; I.V.:1000] Out: 1070 [Urine:1050; Blood:20] Intake/Output this shift:    Resp: clear to auscultation bilaterally Cardio: regular rate and rhythm GI: soft, nontender  Lab Results:   Recent Labs  01/19/15 1442 01/19/15 1452 01/20/15 0335  WBC 11.2*  --  8.8  HGB 13.1 14.6 11.9*  HCT 40.3 43.0 36.3  PLT 183  --  151   BMET  Recent Labs  01/19/15 1442 01/19/15 1452 01/20/15 0335  NA 139 140 136  K 3.4* 3.4* 4.5  CL 108 106 107  CO2 21*  --  26  GLUCOSE 164* 159* 124*  BUN CREATININE 0.80 0.70 0.79  CALCIUM 9.0  --  8.6*   PT/INR  Recent Labs  01/19/15 1442  LABPROT 14.7  INR 1.13   ABG No results for input(s): PHART, HCO3 in the last 72 hours.  Invalid input(s): PCO2, PO2  Studies/Results: Dg Ankle Complete Left  01/21/2015   CLINICAL DATA:  Patient status post ORIF left distal tibia fracture.  EXAM: DG C-ARM 61-120 MIN; LEFT ANKLE COMPLETE - 3+ VIEW  COMPARISON:  Radiograph 01/19/2015  FINDINGS: 3 intraoperative fluoroscopic images were submitted. Patient status post screw fixation of medial malleolus fracture.  IMPRESSION: ORIF medial malleolus fracture.   Electronically Signed   By: Annia Belt M.D.   On: 01/21/2015 14:07   Dg C-arm 1-60 Min  01/21/2015   CLINICAL DATA:  Patient status post ORIF left distal tibia fracture.  EXAM: DG C-ARM 61-120 MIN; LEFT ANKLE COMPLETE - 3+ VIEW  COMPARISON:  Radiograph 01/19/2015  FINDINGS: 3 intraoperative fluoroscopic images were submitted. Patient status  post screw fixation of medial malleolus fracture.  IMPRESSION: ORIF medial malleolus fracture.   Electronically Signed   By: Annia Belt M.D.   On: 01/21/2015 14:07    Anti-infectives: Anti-infectives    Start     Dose/Rate Route Frequency Ordered Stop   01/21/15 1201  ceFAZolin (ANCEF) 2-3 GM-% IVPB SOLR    Comments:  Bell, Sarah   : cabinet override      01/21/15 1201 01/21/15 1533   01/21/15 0945  ANCEF 1 gram in 0.9% normal saline 1000 mL      Other  Once 01/20/15 1622 01/21/15 1100      Assessment/Plan: s/p Procedure(s): OPEN REDUCTION INTERNAL FIXATION (ORIF) MEDIAL MALLEOLUS FRACTURE (Left) Advance diet  L ankle fx. S/p orif. Weight bearing per ortho. PT/OT Facial fxs per ENT Optho to see today  LOS: 3 days    TOTH III,PAUL S 01/22/2015

## 2015-01-22 NOTE — Clinical Social Work Note (Signed)
Clinical Social Work Assessment  Patient Details  Name: Barbara Miranda MRN: [id number] Date of Birth: 1950-08-03  Date of referral:  01/22/15               Reason for consult:  Discharge Planning, Facility Placement                Permission sought to share information with:    Permission granted to share information::  Yes, Verbal Permission Granted  Name::        Agency::   ([place] SNFs)  Relationship::     Contact Information:     Housing/Transportation Living arrangements for the past 2 months:  Single Family Home Source of Information:  Patient Patient Interpreter Needed:  None Criminal Activity/Legal Involvement Pertinent to Current Situation/Hospitalization:  No - Comment as needed Significant Relationships:  Adult Children Lives with:  Self Do you feel safe going back to the place where you live?  Yes Need for family participation in patient care:  Yes (Comment)  Care giving concerns:  Pt would prefer to get rehab at a SNF as opposed to home, if her insurance will cover the cost.  However, family can provide 24 hr A/S if her insurance won't authorize SNF stay.  Social Worker assessment / plan: CSW spoke with pt and her daughter's re: d/c planning. Pt would like to be independent prior to returning home and is agreeable to SNF rehab to help her return to her prior level of functioning.  Placement process explained, and permission granted to search for available beds in [facility].  CSW will continue to follow for d/c planning and facilitate NH tx as appropriate. PT Recommendations:  24 Hour Supervision Information / Referral to community resources:  Skilled Nursing Facility  Patient/Family's Response to care:Agreeable to bed search in [place].  Appreciative of CSW visit/availability to meet with them over the weekend. Patient/Family's Understanding of and Emotional Response to Diagnosis, Current Treatment, and Prognosis: Pt realizes that she will need continued  therapy in order to regain her prior independence.  She believes that she will get stronger quicker by participating in SNF level of therapies, vs HH therapies.  She is anxious to d/c so she can begin working with therapy. Emotional Assessment Appearance:  Appears stated age Attitude/Demeanor/Rapport:   (appropriate) Affect (typically observed):  Accepting Orientation:  Oriented to Self, Oriented to Place, Oriented to  Time, Oriented to Situation Alcohol / Substance use:  Other (SBIRT pending) Psych involvement (Current and /or in the community):  No (Comment)  Discharge Needs  Concerns to be addressed:  Discharge Planning Concerns Readmission within the last 30 days:    Current discharge risk:  None Barriers to Discharge:  No Barriers Identified   DRAKE, Randol Kern, LCSW 01/22/2015, 4:44 PM

## 2015-01-23 ENCOUNTER — Encounter: Payer: Self-pay | Admitting: Ophthalmology

## 2015-01-23 ENCOUNTER — Encounter (HOSPITAL_COMMUNITY): Payer: Self-pay | Admitting: Internal Medicine

## 2015-01-23 DIAGNOSIS — K529 Noninfective gastroenteritis and colitis, unspecified: Secondary | ICD-10-CM | POA: Insufficient documentation

## 2015-01-23 DIAGNOSIS — K219 Gastro-esophageal reflux disease without esophagitis: Secondary | ICD-10-CM | POA: Insufficient documentation
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
## 2015-01-23 NOTE — Clinical Social Work Note (Signed)
Clinical Social Worker continuing to follow patient and family for support and discharge planning needs.  CSW spoke with patient, patient daughter, and patient nephew at bedside to further discuss patient plans at discharge.  Patient would prefer to go home at discharge but aware that she has limitations with living alone.  CSW explained the barrier with patient insurance and potential denial for custodial care needs.  Patient family plans to communicate tonight with additional family members about 24 hour supervision in the home in order for patient to safely return home.  Patient requested that CSW return tomorrow morning - CSW to follow up with patient and patient family tomorrow to finalize patient discharge needs.  CSW available for support as needed.  Macario Golds, [place] [phone]
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
## 2015-01-24 MED ORDER — DOCUSATE SODIUM 100 MG PO CAPS
100.0000 mg | ORAL_CAPSULE | Freq: Two times a day (BID) | ORAL | Status: DC
  Administered 2015-01-24: 100 mg via ORAL
  Filled 2015-01-24: qty 1

## 2015-01-24 MED ORDER — OXYCODONE-ACETAMINOPHEN 5-325 MG PO TABS: 1.0000 | ORAL_TABLET | ORAL | Status: DC | PRN

## 2015-01-24 MED ORDER — POLYETHYLENE GLYCOL 3350 17 G PO PACK
17.0000 g | PACK | Freq: Every day | ORAL | Status: DC
  Administered 2015-01-24: 17 g via ORAL
  Filled 2015-01-24: qty 1
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
## 2015-01-24 NOTE — Discharge Instructions (Signed)
Avoid nose blowing  Stay on soft diet for 6 weeks.    Orthopaedic Trauma Service Discharge Instructions   General Discharge Instructions  WEIGHT BEARING STATUS:Nonweightbearing  RANGE OF MOTION/ACTIVITY:no ankle range of motion until instructed to do so   PAIN MEDICATION USE AND EXPECTATIONS  You have likely been given narcotic medications to help control your pain.  After a traumatic event that results in an fracture (broken bone) with or without surgery, it is ok to use narcotic pain medications to help control one's pain.  We understand that everyone responds to pain differently and each individual patient will be evaluated on a regular basis for the continued need for narcotic medications. Ideally, narcotic medication use should last no more than 6-8 weeks (coinciding with fracture healing).   As a patient it is your responsibility as well to monitor narcotic medication use and report the amount and frequency you use these medications when you come to your office visit.   We would also advise that if you are using narcotic medications, you should take a dose prior to therapy to maximize you participation.  IF YOU ARE ON NARCOTIC MEDICATIONS IT IS NOT PERMISSIBLE TO OPERATE A MOTOR VEHICLE (MOTORCYCLE/CAR/TRUCK/MOPED) OR HEAVY MACHINERY DO NOT MIX NARCOTICS WITH OTHER CNS (CENTRAL NERVOUS SYSTEM) DEPRESSANTS SUCH AS ALCOHOL  Diet: as you were eating previously.  Can use over the counter stool softeners and bowel preparations, such as Miralax, to help with bowel movements.  Narcotics can be constipating.  Be sure to drink plenty of fluids  Wound Care: daily wound care as needed starting 01/23/2015 Wear boot at all times   STOP SMOKING OR USING NICOTINE PRODUCTS!!!!  As discussed nicotine severely impairs your body's ability to heal surgical and traumatic wounds but also impairs bone healing.  Wounds and bone heal by forming microscopic blood vessels (angiogenesis) and nicotine is a  vasoconstrictor (essentially, shrinks blood vessels).  Therefore, if vasoconstriction occurs to these microscopic blood vessels they essentially disappear and are unable to deliver necessary nutrients to the healing tissue.  This is one modifiable factor that you can do to dramatically increase your chances of healing your injury.    (This means no smoking, no nicotine gum, patches, etc)  DO NOT USE NONSTEROIDAL ANTI-INFLAMMATORY DRUGS (NSAID'S)  Using products such as Advil (ibuprofen), Aleve (naproxen), Motrin (ibuprofen) for additional pain control during fracture healing can delay and/or prevent the healing response.  If you would like to take over the counter (OTC) medication, Tylenol (acetaminophen) is ok.  However, some narcotic medications that are given for pain control contain acetaminophen as well. Therefore, you should not exceed more than 4000 mg of tylenol in a day if you do not have liver disease.  Also note that there are may OTC medicines, such as cold medicines and allergy medicines that my contain tylenol as well.  If you have any questions about medications and/or interactions please ask your doctor/PA or your pharmacist.      ICE AND ELEVATE INJURED/OPERATIVE EXTREMITY  Using ice and elevating the injured extremity above your heart can help with swelling and pain control.  Icing in a pulsatile fashion, such as 20 minutes on and 20 minutes off, can be followed.    Do not place ice directly on skin. Make sure there is a barrier between to skin and the ice pack.    Using frozen items such as frozen peas works well as the conform nicely to the are that needs to be iced.  USE AN ACE WRAP OR TED HOSE FOR SWELLING CONTROL  In addition to icing and elevation, Ace wraps or TED hose are used to help limit and resolve swelling.  It is recommended to use Ace wraps or TED hose until you are informed to stop.    When using Ace Wraps start the wrapping distally (farthest away from the body) and  wrap proximally (closer to the body)   Example: If you had surgery on your leg or thing and you do not have a splint on, start the ace wrap at the toes and work your way up to the thigh        If you had surgery on your upper extremity and do not have a splint on, start the ace wrap at your fingers and work your way up to the upper arm  IF YOU ARE IN A SPLINT OR CAST DO NOT REMOVE IT FOR ANY REASON   If your splint gets wet for any reason please contact the office immediately. You may shower in your splint or cast as long as you keep it dry.  This can be done by wrapping in a cast cover or garbage back (or similar)  Do Not stick any thing down your splint or cast such as pencils, money, or hangers to try and scratch yourself with.  If you feel itchy take benadryl as prescribed on the bottle for itching  IF YOU ARE IN A CAM BOOT (BLACK BOOT)  You may remove boot periodically. Perform daily dressing changes as noted below.  Wash the liner of the boot regularly and wear a sock when wearing the boot. It is recommended that you sleep in the boot until told otherwise  CALL THE OFFICE WITH ANY QUESTIONS OR CONCERTS: [phone]     Discharge Pin Site Instructions  Dress pins daily with Kerlix roll starting on POD 2. Wrap the Kerlix so that it tamps the skin down around the pin-skin interface to prevent/limit motion of the skin relative to the pin.  (Pin-skin motion is the primary cause of pain and infection related to external fixator pin sites).  Remove any crust or coagulum that may obstruct drainage with a saline moistened gauze or soap and water.  After POD 3, if there is no discernable drainage on the pin site dressing, the interval for change can by increased to every other day.  You may shower with the fixator, cleaning all pin sites gently with soap and water.  If you have a surgical wound this needs to be completely dry and without drainage before showering.  The extremity can be lifted  by the fixator to facilitate wound care and transfers.  Notify the office/Doctor if you experience increasing drainage, redness, or pain from a pin site, or if you notice purulent (thick, snot-like) drainage.  Discharge Wound Care Instructions  Do NOT apply any ointments, solutions or lotions to pin sites or surgical wounds.  These prevent needed drainage and even though solutions like hydrogen peroxide kill bacteria, they also damage cells lining the pin sites that help fight infection.  Applying lotions or ointments can keep the wounds moist and can cause them to breakdown and open up as well. This can increase the risk for infection. When in doubt call the office.  Surgical incisions should be dressed daily.  If any drainage is noted, use one layer of adaptic, then gauze, Kerlix, and an ace wrap.  Once the incision is completely dry and without drainage, it may  be left open to air out.  Showering may begin 36-48 hours later.  Cleaning gently with soap and water.  Traumatic wounds should be dressed daily as well.    One layer of adaptic, gauze, Kerlix, then ace wrap.  The adaptic can be discontinued once the draining has ceased    If you have a wet to dry dressing: wet the gauze with saline the squeeze as much saline out so the gauze is moist (not soaking wet), place moistened gauze over wound, then place a dry gauze over the moist one, followed by Kerlix wrap, then ace wrap.
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
## 2015-01-24 NOTE — Progress Notes (Signed)
Orthopaedic Trauma Service (OTS)  Subjective: Doing great. Home today.  Objective: No drainage RLE Dressing intact, clean, dry  Edema/ swelling controlled  Sens: DPN, SPN, TN intact  Motor: EHL, FHL, and lessor toe ext and flex all intact grossly  Brisk cap refill, warm to touch  Assessment/Plan: TDWB Maintain boot and dressing like a splint F/u 10 days for  Suture removal  Myrene Galas, MD [facility] [phone] (p)

## 2015-01-24 NOTE — Care Management Note (Signed)
Case Management Note  Patient Details  Name: Myrla Vernell Barrier MRN: [id number] Date of Birth: [birth year]  Subjective/Objective:                    Action/Plan:  Confirmed face sheet information with patient and daughter at bedside . They already have 3 n 1 , walker and wheelchair at home.  Expected Discharge Date:                  Expected Discharge Plan:  Home w Home Health Services  In-House Referral:     Discharge planning Services  CM Consult  Post Acute Care Choice:    Choice offered to:  Patient, Adult Children  DME Arranged:    DME Agency:     HH Arranged:  PT HH Agency:  [facility]  Status of Service:  Completed, signed off  Medicare Important Message Given:    Date Medicare IM Given:    Medicare IM give by:    Date Additional Medicare IM Given:    Additional Medicare Important Message give by:     If discussed at Long Length of Stay Meetings, dates discussed:    Additional Comments:  Kingsley Plan, RN 01/24/2015, 10:38 AM

## 2015-01-24 NOTE — Clinical Social Work Note (Signed)
Clinical Social Worker continuing to follow patient and family for support and discharge planning needs.  Patient plans to return home with 24 hour support.  Patient family involved and supportive.  CM has made arrangements for home health and plan is to discharge home today.  Clinical Social Worker will sign off for now as social work intervention is no longer needed. Please consult [place] again if new need arises.  Macario Golds, [place] [phone]
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
## 2015-01-24 NOTE — Progress Notes (Signed)
Barbara Miranda to be D/C'd Home per MD order.  Discussed with the patient and all questions fully answered.  VSS, Skin clean, dry and intact without evidence of skin break down, no evidence of skin tears noted. IV catheter discontinued intact. Site without signs and symptoms of complications. Dressing and pressure applied.  An After Visit Summary was printed and given to the patient. Patient received prescription.  D/c education completed with patient/family including follow up instructions, medication list, d/c activities limitations if indicated, with other d/c instructions as indicated by MD - patient able to verbalize understanding, all questions fully answered.   Patient instructed to return to ED, call 911, or call MD for any changes in condition.   Patient will be escorted via WC, and D/C home via private auto after she has lunch and gets changed.   Sherene Sires 01/24/2015 1:08 PM

## 2015-01-25 ENCOUNTER — Encounter (HOSPITAL_COMMUNITY): Payer: Self-pay | Admitting: Orthopedic Surgery

## 2015-01-26 ENCOUNTER — Encounter: Payer: Self-pay | Admitting: Cardiology

## 2015-01-26 ENCOUNTER — Telehealth: Payer: Self-pay | Admitting: *Deleted

## 2015-01-26 NOTE — Telephone Encounter (Signed)
Called patient regarding tachy episode on LINQ transmission from 01/21/15.  Patient reports she was in the hospital at that time and does not recall experiencing any symptoms.    Attempted to schedule an appointment for a post-insertion ILR wound check, but patient states she will have to confirm any dates with her daughter as her daughter will transport her.  Able to schedule for 02/02/15 at 11:00am, but advised patient to speak with her daughter to see if there was any way that she could come in sooner than 02/02/15.  Will also make scheduler aware.  Patient aware to call with worsening symptoms, questions, or concerns.

## 2015-02-02 ENCOUNTER — Ambulatory Visit (INDEPENDENT_AMBULATORY_CARE_PROVIDER_SITE_OTHER): Payer: BC Managed Care – PPO | Admitting: *Deleted

## 2015-02-02 ENCOUNTER — Encounter: Payer: Self-pay | Admitting: Cardiology

## 2015-02-02 ENCOUNTER — Telehealth: Payer: Self-pay | Admitting: Neurology

## 2015-02-02 DIAGNOSIS — Z4509 Encounter for adjustment and management of other cardiac device: Secondary | ICD-10-CM

## 2015-02-02 LAB — CUP PACEART INCLINIC DEVICE CHECK
MDC IDC SESS DTM: 20161006113255
Zone Setting Detection Interval: 2000 ms
Zone Setting Detection Interval: 3000 ms
Zone Setting Detection Interval: 400 ms

## 2015-02-02 NOTE — Telephone Encounter (Signed)
Left message on machine for patient to call back.

## 2015-02-02 NOTE — Telephone Encounter (Signed)
Pt needs to talk some about her parkinson medication please call after [phone]

## 2015-02-02 NOTE — Progress Notes (Signed)
Loop wound check in clinic. Bandage removed. Wound well healed with no redness, swelling, or drainage. Battery- good. R waves 0.20mV. Pt with 1 tachy episode lasting 7 seconds at 176bpm (pt was hospitalized and had ankle surgery prior). 1 "symptom" episode the day of LINQ implant- pt does not recall pressing symptom activator. Pt instructed to send manual remote transmission today since no data has been sent since 01/24/15. ROV with WC 05/05/15 at 2:15.

## 2015-02-02 NOTE — Telephone Encounter (Signed)
Follow up appt made with patient per Dr Don Perking request.

## 2015-02-07 ENCOUNTER — Encounter: Payer: Self-pay | Admitting: Neurology

## 2015-02-07 ENCOUNTER — Ambulatory Visit (INDEPENDENT_AMBULATORY_CARE_PROVIDER_SITE_OTHER): Payer: BC Managed Care – PPO | Admitting: Neurology

## 2015-02-07 VITALS — BP 120/80 | HR 105

## 2015-02-07 DIAGNOSIS — G20 Parkinson's disease: Secondary | ICD-10-CM

## 2015-02-07 DIAGNOSIS — T50905A Adverse effect of unspecified drugs, medicaments and biological substances, initial encounter: Secondary | ICD-10-CM

## 2015-02-07 DIAGNOSIS — T887XXA Unspecified adverse effect of drug or medicament, initial encounter: Secondary | ICD-10-CM | POA: Diagnosis not present

## 2015-02-07 MED ORDER — CARBIDOPA-LEVODOPA 25-100 MG PO TABS: 1.0000 | ORAL_TABLET | Freq: Three times a day (TID) | ORAL | Status: DC

## 2015-02-07 NOTE — Progress Notes (Signed)
Note routed to Dr Sudie Bailey.

## 2015-02-07 NOTE — Patient Instructions (Signed)
1. Start Carbidopa Levodopa as follows: 1/2 tab three times a day before meals x 1 wk, then 1/2 in am & noon & 1 in evening for a week, then 1/2 in am &1 at noon &one in evening for a week, then 1 tablet three times a day before meals.    
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
## 2015-02-08 ENCOUNTER — Ambulatory Visit: Payer: BC Managed Care – PPO | Admitting: Neurology

## 2015-02-17 ENCOUNTER — Telehealth: Payer: Self-pay | Admitting: Neurology

## 2015-02-17 NOTE — Telephone Encounter (Signed)
Letter written and mailed to patient.  

## 2015-02-17 NOTE — Telephone Encounter (Signed)
No objection as long as ortho says okay and no driving.  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

## 2015-02-17 NOTE — Telephone Encounter (Signed)
Spoke with patient. She states she feels able to go back to work. She sees her orthopaedic MD on Monday and if he gives her the okay, as well as us, she would like to return. Aware she should not drive for the next 6 months, but she has a ride to work. Please advise if okay to write a note from our standpoint that she can return to work November 1.

## 2015-02-17 NOTE — Telephone Encounter (Signed)
Pt called to request a note for return to work/ to be mailed to her home/call back @ [phone]

## 2015-02-20 ENCOUNTER — Ambulatory Visit (INDEPENDENT_AMBULATORY_CARE_PROVIDER_SITE_OTHER): Payer: BC Managed Care – PPO | Admitting: *Deleted

## 2015-02-20 ENCOUNTER — Telehealth: Payer: Self-pay | Admitting: Cardiology

## 2015-02-20 DIAGNOSIS — R55 Syncope and collapse: Secondary | ICD-10-CM

## 2015-02-20 NOTE — Telephone Encounter (Signed)
Spoke w/ pt and informed her that in order for to receive a new symptom activator that she would need to call [employer]. Informed pt that the last transmission received was 02-16-15. Pt verbalized understanding.

## 2015-02-20 NOTE — Telephone Encounter (Signed)
New Message  Pt called states that she has misplaced her remote device.  However she is requesting a call back to determine if the signal was received.  Device  4. Are you calling to see if we received your device transmission? Yes

## 2015-02-21 NOTE — Progress Notes (Signed)
Loop recorder 

## 2015-03-16 ENCOUNTER — Ambulatory Visit: Payer: BC Managed Care – PPO | Admitting: Neurology

## 2015-03-21 ENCOUNTER — Ambulatory Visit (INDEPENDENT_AMBULATORY_CARE_PROVIDER_SITE_OTHER): Payer: BC Managed Care – PPO | Admitting: *Deleted

## 2015-03-21 DIAGNOSIS — R55 Syncope and collapse: Secondary | ICD-10-CM | POA: Diagnosis not present

## 2015-03-21 LAB — CUP PACEART REMOTE DEVICE CHECK: Date Time Interrogation Session: 20161122220758

## 2015-03-22 NOTE — Progress Notes (Signed)
Carelink Summary Report / Loop recorder 

## 2015-03-24 LAB — CUP PACEART REMOTE DEVICE CHECK: Date Time Interrogation Session: 20161023220756

## 2015-03-24 NOTE — Progress Notes (Signed)
Carelink summary report received. Battery status OK. Normal device function. No brady or AF episodes. 1 symptom episode--ECG suggests no abnormalities. 1 tachy episode, likely SVT, duration ~7sec, peak V 176bpm, patient asymptomatic. 6 pause episodes--all inappropriate, from date/time of implant. Monthly summary reports and ROV with GT on 05/04/15 at 9:30am.

## 2015-04-11 ENCOUNTER — Encounter: Payer: Self-pay | Admitting: Cardiology

## 2015-04-20 ENCOUNTER — Encounter: Payer: Self-pay | Admitting: Neurology

## 2015-04-20 ENCOUNTER — Ambulatory Visit (INDEPENDENT_AMBULATORY_CARE_PROVIDER_SITE_OTHER): Payer: BC Managed Care – PPO | Admitting: Neurology

## 2015-04-20 ENCOUNTER — Ambulatory Visit (INDEPENDENT_AMBULATORY_CARE_PROVIDER_SITE_OTHER): Payer: BC Managed Care – PPO | Admitting: *Deleted

## 2015-04-20 VITALS — BP 134/70 | HR 77 | Ht 61.5 in | Wt 155.0 lb

## 2015-04-20 DIAGNOSIS — G20 Parkinson's disease: Secondary | ICD-10-CM

## 2015-04-20 DIAGNOSIS — R55 Syncope and collapse: Secondary | ICD-10-CM | POA: Diagnosis not present

## 2015-04-20 DIAGNOSIS — G20A1 Parkinson's disease without dyskinesia, without mention of fluctuations: Secondary | ICD-10-CM

## 2015-04-20 MED ORDER — CARBIDOPA-LEVODOPA 25-100 MG PO TABS: 1.0000 | ORAL_TABLET | Freq: Three times a day (TID) | ORAL | Status: DC
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
## 2015-04-21 NOTE — Progress Notes (Signed)
Carelink Summary Report / Loop Recorder 

## 2015-05-04 ENCOUNTER — Ambulatory Visit (INDEPENDENT_AMBULATORY_CARE_PROVIDER_SITE_OTHER): Payer: BC Managed Care – PPO | Admitting: Internal Medicine

## 2015-05-04 ENCOUNTER — Encounter: Payer: Self-pay | Admitting: Internal Medicine

## 2015-05-04 VITALS — BP 120/70 | HR 64 | Ht 61.5 in | Wt 154.0 lb

## 2015-05-04 DIAGNOSIS — R55 Syncope and collapse: Secondary | ICD-10-CM | POA: Diagnosis not present

## 2015-05-04 NOTE — Assessment & Plan Note (Signed)
She has had no recurrent symptoms. She will undergo watchful waiting.  

## 2015-05-04 NOTE — Patient Instructions (Signed)
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
## 2015-05-04 NOTE — Assessment & Plan Note (Signed)
She will maintain a low fat diet. She is exercising daily.

## 2015-05-05 ENCOUNTER — Encounter: Payer: BC Managed Care – PPO | Admitting: Cardiology

## 2015-05-15 ENCOUNTER — Telehealth: Payer: Self-pay | Admitting: Neurology

## 2015-05-15 NOTE — Telephone Encounter (Signed)
Patient asking for copy of imaging from hospital visit. Imaging reports faxed to [phone] with confirmation received per patient's request.

## 2015-05-15 NOTE — Telephone Encounter (Signed)
VM-PT left message to have a call back/Dawn CB# [phone]

## 2015-05-22 ENCOUNTER — Ambulatory Visit (INDEPENDENT_AMBULATORY_CARE_PROVIDER_SITE_OTHER): Payer: BC Managed Care – PPO | Admitting: *Deleted

## 2015-05-22 ENCOUNTER — Telehealth: Payer: Self-pay | Admitting: Neurology

## 2015-05-22 DIAGNOSIS — R55 Syncope and collapse: Secondary | ICD-10-CM | POA: Diagnosis not present

## 2015-05-22 NOTE — Telephone Encounter (Signed)
Don't think that the levodopa caused headaches.  See me about letter

## 2015-05-22 NOTE — Telephone Encounter (Signed)
-----   Message from Ander Purpura sent at 05/22/2015  3:13 PM EST ----- Regarding: pt call Pat called and wants Lesly Rubenstein to call her.  Having headaches and is wondering if the Sinemet could be causing these.  She is ok if Dr. Arbutus Leas wants her to come in.
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
## 2015-05-23 ENCOUNTER — Encounter: Payer: Self-pay | Admitting: Neurology

## 2015-05-23 NOTE — Telephone Encounter (Signed)
Patient made aware Dr Tat agrees headaches are probably not from levodopa. Aware letter faxed to number provided with confirmation received. She will call back if she needs [place].

## 2015-05-23 NOTE — Progress Notes (Signed)
Carelink Summary Report / Loop Recorder 

## 2015-06-03 LAB — CUP PACEART REMOTE DEVICE CHECK: Date Time Interrogation Session: 20161222223608

## 2015-06-16 ENCOUNTER — Encounter: Payer: Self-pay | Admitting: Cardiology

## 2015-06-20 ENCOUNTER — Ambulatory Visit (INDEPENDENT_AMBULATORY_CARE_PROVIDER_SITE_OTHER): Payer: BC Managed Care – PPO | Admitting: *Deleted

## 2015-06-20 DIAGNOSIS — R55 Syncope and collapse: Secondary | ICD-10-CM | POA: Diagnosis not present

## 2015-06-21 NOTE — Progress Notes (Signed)
Carelink Summary Report / Loop Recorder 

## 2015-06-23 ENCOUNTER — Encounter: Payer: Self-pay | Admitting: Cardiology

## 2015-06-26 ENCOUNTER — Telehealth: Payer: Self-pay | Admitting: Internal Medicine

## 2015-06-26 NOTE — Telephone Encounter (Signed)
Spoke w/ pt and informed her why she got the letter. Informed her that her home monitor is working now and she doesn't have to do anything. She verbalized understanding.

## 2015-06-26 NOTE — Telephone Encounter (Signed)
New Message  Pt received letter that she has not had her device checked in the remcommended time- pt had remote checks in Jan and Feb of 2017 and seen by Dr Ladona Ridgel 05/04/15. Please call back and discuss.

## 2015-07-02 ENCOUNTER — Other Ambulatory Visit: Payer: Self-pay | Admitting: Neurology

## 2015-07-03 NOTE — Telephone Encounter (Signed)
Carbidopa Levodopa refill requested. Per last office note- patient to remain on medication. Refill approved and sent to patient's pharmacy.   

## 2015-07-03 NOTE — Progress Notes (Signed)
Carelink summary report received. Battery status OK. Normal device function. No new symptom episodes, tachy episodes, brady, or pause episodes. No new AF episodes. Monthly summary reports and ROV/PRN 

## 2015-07-06 LAB — CUP PACEART REMOTE DEVICE CHECK: MDC IDC SESS DTM: 20170220230623

## 2015-07-06 NOTE — Progress Notes (Signed)
Carelink summary report received. Battery status OK. Normal device function. No new symptom episodes, tachy episodes, brady, or pause episodes. No new AF episodes. Monthly summary reports and ROV/PRN 

## 2015-07-07 LAB — CUP PACEART REMOTE DEVICE CHECK: MDC IDC SESS DTM: 20170121223527

## 2015-07-19 ENCOUNTER — Ambulatory Visit (INDEPENDENT_AMBULATORY_CARE_PROVIDER_SITE_OTHER): Payer: BC Managed Care – PPO | Admitting: *Deleted

## 2015-07-19 DIAGNOSIS — R55 Syncope and collapse: Secondary | ICD-10-CM | POA: Diagnosis not present

## 2015-07-20 ENCOUNTER — Ambulatory Visit (INDEPENDENT_AMBULATORY_CARE_PROVIDER_SITE_OTHER): Payer: BC Managed Care – PPO | Admitting: Neurology

## 2015-07-20 ENCOUNTER — Encounter: Payer: Self-pay | Admitting: Neurology

## 2015-07-20 ENCOUNTER — Other Ambulatory Visit (INDEPENDENT_AMBULATORY_CARE_PROVIDER_SITE_OTHER): Payer: BC Managed Care – PPO

## 2015-07-20 VITALS — BP 118/70 | HR 70 | Ht 61.5 in | Wt 152.0 lb

## 2015-07-20 DIAGNOSIS — R42 Dizziness and giddiness: Secondary | ICD-10-CM

## 2015-07-20 DIAGNOSIS — R270 Ataxia, unspecified: Secondary | ICD-10-CM | POA: Diagnosis not present

## 2015-07-20 DIAGNOSIS — Z5181 Encounter for therapeutic drug level monitoring: Secondary | ICD-10-CM

## 2015-07-20 DIAGNOSIS — G20 Parkinson's disease: Secondary | ICD-10-CM

## 2015-07-20 DIAGNOSIS — G20A1 Parkinson's disease without dyskinesia, without mention of fluctuations: Secondary | ICD-10-CM

## 2015-07-20 LAB — COMPREHENSIVE METABOLIC PANEL
ALBUMIN: 4 g/dL (ref 3.5–5.2)
ALT: 4 U/L (ref 0–35)
AST: 17 U/L (ref 0–37)
Alkaline Phosphatase: 80 U/L (ref 39–117)
BUN: 19 mg/dL (ref 6–23)
CHLORIDE: 108 meq/L (ref 96–112)
CO2: 22 meq/L (ref 19–32)
CREATININE: 0.73 mg/dL (ref 0.40–1.20)
Calcium: 9.2 mg/dL (ref 8.4–10.5)
GFR: 85.42 mL/min (ref >60.00)
Glucose, Bld: 109 mg/dL — ABNORMAL HIGH (ref 70–99)
Potassium: 3.4 mEq/L — ABNORMAL LOW (ref 3.5–5.1)
SODIUM: 138 meq/L (ref 135–145)
Total Bilirubin: 0.4 mg/dL (ref 0.2–1.2)
Total Protein: 7.1 g/dL (ref 6.0–8.3)

## 2015-07-20 LAB — CBC WITH DIFFERENTIAL/PLATELET
BASOS ABS: 0 10*3/uL (ref 0.0–0.1)
BASOS PCT: 0.1 % (ref 0.0–3.0)
EOS ABS: 0.1 10*3/uL (ref 0.0–0.7)
Eosinophils Relative: 2 % (ref 0.0–5.0)
HCT: 41.7 % (ref 36.0–46.0)
HEMOGLOBIN: 13.9 g/dL (ref 12.0–15.0)
Lymphocytes Relative: 32.5 % (ref 12.0–46.0)
Lymphs Abs: 2.2 10*3/uL (ref 0.7–4.0)
MCHC: 33.4 g/dL (ref 30.0–36.0)
MCV: 84.7 fl (ref 78.0–100.0)
MONO ABS: 0.5 10*3/uL (ref 0.1–1.0)
Monocytes Relative: 8 % (ref 3.0–12.0)
Neutro Abs: 3.9 10*3/uL (ref 1.4–7.7)
Neutrophils Relative %: 57.4 % (ref 43.0–77.0)
PLATELETS: 187 10*3/uL (ref 150.0–400.0)
RBC: 4.92 Mil/uL (ref 3.87–5.11)
RDW: 15.6 % — AB (ref 11.5–15.5)
WBC: 6.8 10*3/uL (ref 4.0–10.5)

## 2015-07-20 MED ORDER — TOPIRAMATE 100 MG PO TABS: 100.0000 mg | ORAL_TABLET | Freq: Two times a day (BID) | ORAL | Status: DC
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
## 2015-07-20 NOTE — Progress Notes (Signed)
Carelink Summary Report / Loop Recorder 

## 2015-07-20 NOTE — Patient Instructions (Signed)
1. Your provider has requested that you have labwork completed today. Please go to [facility] ([id number]) on the second floor of this building before leaving the office today. You do not need to check in. If you are not called within 15 minutes please check with the front desk.  2. Increase Topamax 100 mg - 1 twice daily.

## 2015-07-21 LAB — TSH: TSH: 1.96 u[IU]/mL (ref 0.35–4.50)

## 2015-07-28 ENCOUNTER — Telehealth: Payer: Self-pay | Admitting: Neurology

## 2015-07-28 ENCOUNTER — Telehealth: Payer: Self-pay | Admitting: Cardiology

## 2015-07-28 NOTE — Telephone Encounter (Signed)
Spoke with patient to see if balance was any better. She states Friday morning after seeing us on Thursday she woke up and just didn't feel right so she went to the doctor and was diagnosed with a pretty bad UTI. This has now been treated and she is feeling fine. No physical therapy orders for now. She will call if anything changes.

## 2015-07-28 NOTE — Telephone Encounter (Signed)
Spoke w/ pt and requested that she send a manual transmission b/c her home monitor has not updated in at least 14 days. Pt verbalized understanding.

## 2015-08-07 ENCOUNTER — Other Ambulatory Visit: Payer: Self-pay | Admitting: Neurology

## 2015-08-07 NOTE — Telephone Encounter (Signed)
Carbidopa Levodopa 25/100 refill requested. Per last office note- patient to remain on medication. Refill approved and sent to patient's pharmacy.   

## 2015-08-18 ENCOUNTER — Ambulatory Visit (INDEPENDENT_AMBULATORY_CARE_PROVIDER_SITE_OTHER): Payer: BC Managed Care – PPO | Admitting: *Deleted

## 2015-08-18 DIAGNOSIS — R55 Syncope and collapse: Secondary | ICD-10-CM

## 2015-08-21 NOTE — Progress Notes (Signed)
Carelink Summary Report / Loop Recorder 

## 2015-09-11 ENCOUNTER — Encounter: Payer: Self-pay | Admitting: Internal Medicine

## 2015-09-18 ENCOUNTER — Ambulatory Visit (INDEPENDENT_AMBULATORY_CARE_PROVIDER_SITE_OTHER): Payer: BC Managed Care – PPO | Admitting: *Deleted

## 2015-09-18 DIAGNOSIS — R55 Syncope and collapse: Secondary | ICD-10-CM | POA: Diagnosis not present

## 2015-09-18 NOTE — Progress Notes (Signed)
Carelink Summary Report / Loop Recorder 

## 2015-09-21 ENCOUNTER — Other Ambulatory Visit: Payer: Self-pay

## 2015-09-21 DIAGNOSIS — Z1231 Encounter for screening mammogram for malignant neoplasm of breast: Secondary | ICD-10-CM

## 2015-09-23 LAB — CUP PACEART REMOTE DEVICE CHECK: Date Time Interrogation Session: 20170322233520

## 2015-09-23 NOTE — Progress Notes (Signed)
Carelink summary report received. Battery status OK. Normal device function. No new symptom episodes, tachy episodes, brady, or pause episodes. No new AF episodes. Monthly summary reports and ROV/PRN 

## 2015-09-25 LAB — CUP PACEART REMOTE DEVICE CHECK: Date Time Interrogation Session: 20170422000748

## 2015-09-25 NOTE — Progress Notes (Signed)
[facility] summary report received. Battery status OK. Normal device function. No new symptom episodes, brady episodes. No new AF episodes.1 pause episode, undersensing. 1 tachy episode, no EGM available. Monthly summary reports and ROV/PRN

## 2015-10-06 ENCOUNTER — Ambulatory Visit: Payer: BC Managed Care – PPO

## 2015-10-09 ENCOUNTER — Other Ambulatory Visit: Payer: Self-pay | Admitting: Obstetrics and Gynecology

## 2015-10-09 DIAGNOSIS — Z1231 Encounter for screening mammogram for malignant neoplasm of breast: Secondary | ICD-10-CM

## 2015-10-10 ENCOUNTER — Ambulatory Visit: Payer: BC Managed Care – PPO

## 2015-10-11 ENCOUNTER — Ambulatory Visit
Admission: RE | Admit: 2015-10-11 | Discharge: 2015-10-11 | Disposition: A | Discharge status: Home / Self Care | Payer: BC Managed Care – PPO | Source: Ambulatory Visit | Attending: Obstetrics and Gynecology | Admitting: Obstetrics and Gynecology

## 2015-10-11 DIAGNOSIS — Z1231 Encounter for screening mammogram for malignant neoplasm of breast: Secondary | ICD-10-CM

## 2015-10-17 ENCOUNTER — Other Ambulatory Visit: Payer: Self-pay | Admitting: Obstetrics and Gynecology

## 2015-10-17 ENCOUNTER — Ambulatory Visit (INDEPENDENT_AMBULATORY_CARE_PROVIDER_SITE_OTHER): Payer: BC Managed Care – PPO | Admitting: *Deleted

## 2015-10-17 DIAGNOSIS — R55 Syncope and collapse: Secondary | ICD-10-CM

## 2015-10-17 DIAGNOSIS — R928 Other abnormal and inconclusive findings on diagnostic imaging of breast: Secondary | ICD-10-CM

## 2015-10-18 NOTE — Progress Notes (Signed)
Carelink Summary Report / Loop Recorder 

## 2015-10-24 ENCOUNTER — Ambulatory Visit: Payer: BC Managed Care – PPO | Admitting: Neurology

## 2015-10-25 ENCOUNTER — Ambulatory Visit
Admission: RE | Admit: 2015-10-25 | Discharge: 2015-10-25 | Disposition: A | Discharge status: Home / Self Care | Payer: BC Managed Care – PPO | Source: Ambulatory Visit | Attending: Obstetrics and Gynecology | Admitting: Obstetrics and Gynecology

## 2015-10-25 DIAGNOSIS — R928 Other abnormal and inconclusive findings on diagnostic imaging of breast: Secondary | ICD-10-CM

## 2015-10-28 LAB — CUP PACEART REMOTE DEVICE CHECK
Date Time Interrogation Session: 20170522000714
Date Time Interrogation Session: 20170621003730

## 2015-11-07 ENCOUNTER — Encounter: Payer: Self-pay | Admitting: Neurology

## 2015-11-07 ENCOUNTER — Ambulatory Visit (INDEPENDENT_AMBULATORY_CARE_PROVIDER_SITE_OTHER): Payer: BC Managed Care – PPO | Admitting: Neurology

## 2015-11-07 VITALS — BP 114/62 | HR 64 | Ht 61.5 in | Wt 158.0 lb

## 2015-11-07 DIAGNOSIS — R51 Headache: Secondary | ICD-10-CM

## 2015-11-07 DIAGNOSIS — R519 Headache, unspecified: Secondary | ICD-10-CM

## 2015-11-07 DIAGNOSIS — G20 Parkinson's disease: Secondary | ICD-10-CM
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
## 2015-11-10 ENCOUNTER — Telehealth: Payer: Self-pay | Admitting: Cardiology

## 2015-11-10 NOTE — Telephone Encounter (Signed)
LMOVM requesting that pt send manual transmission b/c home monitor has not updated in at least 14 days.    

## 2015-11-16 ENCOUNTER — Telehealth: Payer: Self-pay | Admitting: Neurology

## 2015-11-16 ENCOUNTER — Ambulatory Visit (INDEPENDENT_AMBULATORY_CARE_PROVIDER_SITE_OTHER): Payer: BC Managed Care – PPO | Admitting: *Deleted

## 2015-11-16 DIAGNOSIS — R55 Syncope and collapse: Secondary | ICD-10-CM | POA: Diagnosis not present

## 2015-11-16 NOTE — Telephone Encounter (Signed)
Patient was transferred to my voicemail where she states she "messed up her medication". I called patient back and she states that when we increased her Topamax to 100 mg BID instead of 50 mg TID (around the end of March) that she has been taking the 100 mg TID (she thinks this is when she started doing this.) She has been taking each one with her Levodopa dosages at 7 am, 11 am, and 4 pm. No side effects. I advised patient to cut her 11 am dose in half for one week, then stop that dose. She will then remain on 1 tablet at 7 am and 1 at 4 pm. Made her aware I would run this by Dr. Arbutus Leasat and call if she would like her to do anything different.

## 2015-11-16 NOTE — Telephone Encounter (Signed)
PT called and said she left a message earlier and she needed to change the number she left to [phone]/Barbara Miranda

## 2015-11-17 NOTE — Progress Notes (Signed)
Carelink Summary Report / Loop Recorder 

## 2015-12-14 LAB — CUP PACEART REMOTE DEVICE CHECK: Date Time Interrogation Session: 20170721013648

## 2015-12-18 ENCOUNTER — Ambulatory Visit (INDEPENDENT_AMBULATORY_CARE_PROVIDER_SITE_OTHER): Payer: BC Managed Care – PPO | Admitting: *Deleted

## 2015-12-18 DIAGNOSIS — R55 Syncope and collapse: Secondary | ICD-10-CM

## 2015-12-18 NOTE — Progress Notes (Signed)
Carelink Summary Report / Loop Report 

## 2016-01-15 ENCOUNTER — Ambulatory Visit (INDEPENDENT_AMBULATORY_CARE_PROVIDER_SITE_OTHER): Payer: BC Managed Care – PPO | Admitting: *Deleted

## 2016-01-15 DIAGNOSIS — R55 Syncope and collapse: Secondary | ICD-10-CM

## 2016-01-15 LAB — CUP PACEART REMOTE DEVICE CHECK: Date Time Interrogation Session: 20170820020923

## 2016-01-15 NOTE — Progress Notes (Signed)
Carelink summary report received. Battery status OK. Normal device function. Fair histograms. No new symptom episodes, tachy episodes, brady, or pause episodes. No new AF episodes. Monthly summary reports and ROV/PRN 

## 2016-01-16 NOTE — Progress Notes (Signed)
Carelink Summary Report / Loop Recorder 
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
## 2016-02-07 ENCOUNTER — Encounter: Payer: Self-pay | Admitting: Neurology

## 2016-02-07 ENCOUNTER — Ambulatory Visit (INDEPENDENT_AMBULATORY_CARE_PROVIDER_SITE_OTHER): Payer: BC Managed Care – PPO | Admitting: Neurology

## 2016-02-07 DIAGNOSIS — G20 Parkinson's disease: Secondary | ICD-10-CM

## 2016-02-07 DIAGNOSIS — Z5181 Encounter for therapeutic drug level monitoring: Secondary | ICD-10-CM

## 2016-02-07 DIAGNOSIS — R42 Dizziness and giddiness: Secondary | ICD-10-CM

## 2016-02-07 DIAGNOSIS — R270 Ataxia, unspecified: Secondary | ICD-10-CM

## 2016-02-07 MED ORDER — TOPIRAMATE 100 MG PO TABS: 100.0000 mg | ORAL_TABLET | Freq: Two times a day (BID) | ORAL | 1 refills | Status: DC

## 2016-02-07 MED ORDER — CARBIDOPA-LEVODOPA 25-100 MG PO TABS: 1.0000 | ORAL_TABLET | Freq: Three times a day (TID) | ORAL | 5 refills | Status: DC

## 2016-02-09 LAB — CUP PACEART REMOTE DEVICE CHECK: MDC IDC SESS DTM: 20170919023607

## 2016-02-09 NOTE — Progress Notes (Signed)
Carelink summary report received. Battery status OK. Normal device function. No new symptom episodes, tachy episodes, brady, or pause episodes. No new AF episodes. Monthly summary reports and ROV/PRN 

## 2016-02-13 DIAGNOSIS — J3489 Other specified disorders of nose and nasal sinuses: Secondary | ICD-10-CM | POA: Insufficient documentation

## 2016-02-14 ENCOUNTER — Ambulatory Visit (INDEPENDENT_AMBULATORY_CARE_PROVIDER_SITE_OTHER): Payer: BC Managed Care – PPO | Admitting: *Deleted

## 2016-02-14 DIAGNOSIS — R55 Syncope and collapse: Secondary | ICD-10-CM | POA: Diagnosis not present

## 2016-02-15 NOTE — Progress Notes (Signed)
Carelink Summary Report / Loop Recorder 

## 2016-03-15 ENCOUNTER — Ambulatory Visit (INDEPENDENT_AMBULATORY_CARE_PROVIDER_SITE_OTHER): Payer: BC Managed Care – PPO | Admitting: *Deleted

## 2016-03-15 DIAGNOSIS — R55 Syncope and collapse: Secondary | ICD-10-CM

## 2016-03-16 LAB — CUP PACEART REMOTE DEVICE CHECK
Date Time Interrogation Session: 20171019030649
MDC IDC PG IMPLANT DT: 20160923

## 2016-03-16 NOTE — Progress Notes (Signed)
[facility] summary report received. Battery status OK. Normal device function. No new symptom episodes, tachy episodes, brady episodes. No new AF episodes. 3 pause episodes, undersensing. Monthly summary reports and ROV/PRN 

## 2016-03-18 NOTE — Progress Notes (Signed)
Carelink Summary Report / Loop Recorder 

## 2016-04-15 ENCOUNTER — Ambulatory Visit (INDEPENDENT_AMBULATORY_CARE_PROVIDER_SITE_OTHER): Payer: BC Managed Care – PPO | Admitting: *Deleted

## 2016-04-15 DIAGNOSIS — R55 Syncope and collapse: Secondary | ICD-10-CM

## 2016-04-15 NOTE — Progress Notes (Signed)
Carelink Summary Report / Loop Recorder 

## 2016-04-23 ENCOUNTER — Other Ambulatory Visit: Payer: Self-pay | Admitting: Obstetrics and Gynecology

## 2016-04-23 DIAGNOSIS — N6320 Unspecified lump in the left breast, unspecified quadrant: Secondary | ICD-10-CM

## 2016-04-25 LAB — CUP PACEART REMOTE DEVICE CHECK
Date Time Interrogation Session: 20171118033842
Implantable Pulse Generator Implant Date: 20160923

## 2016-04-30 ENCOUNTER — Other Ambulatory Visit: Payer: BC Managed Care – PPO

## 2016-05-10 ENCOUNTER — Ambulatory Visit
Admission: RE | Admit: 2016-05-10 | Discharge: 2016-05-10 | Disposition: A | Discharge status: Home / Self Care | Payer: BC Managed Care – PPO | Source: Ambulatory Visit | Attending: Obstetrics and Gynecology | Admitting: Obstetrics and Gynecology

## 2016-05-10 ENCOUNTER — Encounter: Payer: BC Managed Care – PPO | Admitting: Internal Medicine

## 2016-05-10 DIAGNOSIS — N6320 Unspecified lump in the left breast, unspecified quadrant: Secondary | ICD-10-CM

## 2016-05-14 ENCOUNTER — Ambulatory Visit (INDEPENDENT_AMBULATORY_CARE_PROVIDER_SITE_OTHER): Payer: BC Managed Care – PPO | Admitting: *Deleted

## 2016-05-14 DIAGNOSIS — R55 Syncope and collapse: Secondary | ICD-10-CM

## 2016-05-17 NOTE — Progress Notes (Signed)
Carelink Summary Report / Loop Recorder 

## 2016-06-03 LAB — CUP PACEART REMOTE DEVICE CHECK
Implantable Pulse Generator Implant Date: 20160923
MDC IDC SESS DTM: 20171218043916

## 2016-06-07 ENCOUNTER — Encounter: Payer: BC Managed Care – PPO | Admitting: Internal Medicine

## 2016-06-13 ENCOUNTER — Ambulatory Visit (INDEPENDENT_AMBULATORY_CARE_PROVIDER_SITE_OTHER): Payer: BC Managed Care – PPO | Admitting: *Deleted

## 2016-06-13 DIAGNOSIS — R55 Syncope and collapse: Secondary | ICD-10-CM

## 2016-06-17 ENCOUNTER — Ambulatory Visit: Payer: BC Managed Care – PPO | Admitting: Neurology

## 2016-06-17 ENCOUNTER — Encounter: Payer: BC Managed Care – PPO | Admitting: Internal Medicine

## 2016-06-17 NOTE — Progress Notes (Signed)
Carelink Summary Report / Loop Recorder 
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
## 2016-06-20 ENCOUNTER — Ambulatory Visit (INDEPENDENT_AMBULATORY_CARE_PROVIDER_SITE_OTHER): Payer: BC Managed Care – PPO | Admitting: Neurology

## 2016-06-20 ENCOUNTER — Encounter: Payer: Self-pay | Admitting: Neurology

## 2016-06-20 VITALS — BP 110/72 | HR 70 | Ht 61.5 in | Wt 163.0 lb

## 2016-06-20 DIAGNOSIS — F330 Major depressive disorder, recurrent, mild: Secondary | ICD-10-CM

## 2016-06-20 DIAGNOSIS — G20 Parkinson's disease: Secondary | ICD-10-CM | POA: Diagnosis not present

## 2016-06-20 NOTE — Patient Instructions (Signed)
1.  Decrease prozac to 20 mg daily.  I sent in a RX for this  2.  I sent in a 90 day RX for carbidopa/levodopa 25/100 three times a day

## 2016-06-22 LAB — CUP PACEART REMOTE DEVICE CHECK
Date Time Interrogation Session: 20180117044026
Implantable Pulse Generator Implant Date: 20160923

## 2016-06-24 ENCOUNTER — Telehealth: Payer: Self-pay | Admitting: Neurology

## 2016-06-24 NOTE — Telephone Encounter (Signed)
Patient called and states that we were to call in some medication but [facility] did not have the refills as of Saturday please call them patient phone number is [phone]

## 2016-06-25 MED ORDER — CARBIDOPA-LEVODOPA 25-100 MG PO TABS: 1.0000 | ORAL_TABLET | Freq: Three times a day (TID) | ORAL | 1 refills | Status: DC

## 2016-06-25 MED ORDER — FLUOXETINE HCL 20 MG PO CAPS: 20.0000 mg | ORAL_CAPSULE | Freq: Every day | ORAL | 1 refills | Status: DC

## 2016-06-25 NOTE — Telephone Encounter (Signed)
RX sent to pharmacy  

## 2016-06-25 NOTE — Telephone Encounter (Signed)
LMOM making patient aware that medications were sent in this morning and apologized they were not sent in Friday.

## 2016-07-04 LAB — CUP PACEART REMOTE DEVICE CHECK
Date Time Interrogation Session: 20180216044208
MDC IDC PG IMPLANT DT: 20160923

## 2016-07-15 ENCOUNTER — Ambulatory Visit (INDEPENDENT_AMBULATORY_CARE_PROVIDER_SITE_OTHER): Payer: BC Managed Care – PPO | Admitting: *Deleted

## 2016-07-15 DIAGNOSIS — R55 Syncope and collapse: Secondary | ICD-10-CM | POA: Diagnosis not present

## 2016-07-15 NOTE — Progress Notes (Signed)
Carelink Summary Report / Loop Recorder 

## 2016-07-17 ENCOUNTER — Encounter: Payer: BC Managed Care – PPO | Admitting: Internal Medicine

## 2016-07-24 LAB — CUP PACEART REMOTE DEVICE CHECK
Date Time Interrogation Session: 20180318044650
MDC IDC PG IMPLANT DT: 20160923

## 2016-07-30 ENCOUNTER — Ambulatory Visit (INDEPENDENT_AMBULATORY_CARE_PROVIDER_SITE_OTHER): Payer: BC Managed Care – PPO | Admitting: Internal Medicine

## 2016-07-30 ENCOUNTER — Encounter: Payer: Self-pay | Admitting: Internal Medicine

## 2016-07-30 ENCOUNTER — Encounter (INDEPENDENT_AMBULATORY_CARE_PROVIDER_SITE_OTHER): Payer: Self-pay

## 2016-07-30 VITALS — BP 124/74 | HR 64 | Ht 62.5 in | Wt 164.8 lb

## 2016-07-30 DIAGNOSIS — R55 Syncope and collapse: Secondary | ICD-10-CM | POA: Diagnosis not present
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
## 2016-07-30 NOTE — Patient Instructions (Signed)
Medication Instructions:  Your physician recommends that you continue on your current medications as directed. Please refer to the Current Medication list given to you today.   Labwork: none  Testing/Procedures: none  Follow-Up: Remote monitoring is used to monitor your Pacemaker or ICD from home. This monitoring reduces the number of office visits required to check your device to one time per year. It allows us to keep an eye on the functioning of your device to ensure it is working properly. You are scheduled for a device check from home on 10/29/2016. You may send your transmission at any time that day. If you have a wireless device, the transmission will be sent automatically. After your physician reviews your transmission, you will receive a postcard with your next transmission date.  Your physician wants you to follow-up in: 12 months with Dr. Taylor. You will receive a reminder letter in the mail two months in advance. If you don't receive a letter, please call our office to schedule the follow-up appointment.   Any Other Special Instructions Will Be Listed Below (If Applicable).     If you need a refill on your cardiac medications before your next appointment, please call your pharmacy.   

## 2016-08-13 ENCOUNTER — Ambulatory Visit (INDEPENDENT_AMBULATORY_CARE_PROVIDER_SITE_OTHER): Payer: BC Managed Care – PPO | Admitting: *Deleted

## 2016-08-13 DIAGNOSIS — R55 Syncope and collapse: Secondary | ICD-10-CM | POA: Diagnosis not present

## 2016-08-13 NOTE — Progress Notes (Signed)
Carelink Summary Report / Loop Recorder 

## 2016-08-28 LAB — CUP PACEART REMOTE DEVICE CHECK
Implantable Pulse Generator Implant Date: 20160923
MDC IDC SESS DTM: 20180417053542

## 2016-09-04 ENCOUNTER — Other Ambulatory Visit: Payer: Self-pay | Admitting: Neurology

## 2016-09-04 DIAGNOSIS — Z5181 Encounter for therapeutic drug level monitoring: Secondary | ICD-10-CM

## 2016-09-04 DIAGNOSIS — R270 Ataxia, unspecified: Secondary | ICD-10-CM

## 2016-09-04 DIAGNOSIS — G20 Parkinson's disease: Secondary | ICD-10-CM

## 2016-09-04 DIAGNOSIS — R42 Dizziness and giddiness: Secondary | ICD-10-CM

## 2016-09-12 ENCOUNTER — Ambulatory Visit (INDEPENDENT_AMBULATORY_CARE_PROVIDER_SITE_OTHER): Payer: BC Managed Care – PPO | Admitting: *Deleted

## 2016-09-12 DIAGNOSIS — R55 Syncope and collapse: Secondary | ICD-10-CM

## 2016-09-12 NOTE — Progress Notes (Signed)
Carelink Summary Report / Loop Recorder 
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
## 2016-09-20 ENCOUNTER — Ambulatory Visit: Payer: BC Managed Care – PPO | Admitting: Neurology

## 2016-09-23 LAB — CUP PACEART REMOTE DEVICE CHECK
Date Time Interrogation Session: 20180517060600
Implantable Pulse Generator Implant Date: 20160923

## 2016-10-14 ENCOUNTER — Ambulatory Visit (INDEPENDENT_AMBULATORY_CARE_PROVIDER_SITE_OTHER): Payer: BC Managed Care – PPO | Admitting: *Deleted

## 2016-10-14 DIAGNOSIS — R55 Syncope and collapse: Secondary | ICD-10-CM

## 2016-10-14 NOTE — Progress Notes (Signed)
Carelink Summary Report / Loop Recorder 

## 2016-10-22 LAB — CUP PACEART REMOTE DEVICE CHECK
Date Time Interrogation Session: 20180616060918
MDC IDC PG IMPLANT DT: 20160923
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
## 2016-10-29 ENCOUNTER — Ambulatory Visit (INDEPENDENT_AMBULATORY_CARE_PROVIDER_SITE_OTHER): Payer: BC Managed Care – PPO | Admitting: Neurology

## 2016-10-29 ENCOUNTER — Encounter: Payer: Self-pay | Admitting: Neurology

## 2016-10-29 VITALS — BP 128/82 | HR 62 | Ht 61.5 in | Wt 169.0 lb

## 2016-10-29 DIAGNOSIS — G20A1 Parkinson's disease without dyskinesia, without mention of fluctuations: Secondary | ICD-10-CM

## 2016-10-29 DIAGNOSIS — R51 Headache: Secondary | ICD-10-CM

## 2016-10-29 DIAGNOSIS — G20 Parkinson's disease: Secondary | ICD-10-CM

## 2016-10-29 DIAGNOSIS — R519 Headache, unspecified: Secondary | ICD-10-CM

## 2016-10-29 DIAGNOSIS — F330 Major depressive disorder, recurrent, mild: Secondary | ICD-10-CM

## 2016-10-29 MED ORDER — FLUOXETINE HCL 40 MG PO CAPS: 40.0000 mg | ORAL_CAPSULE | Freq: Every day | ORAL | 1 refills | Status: DC

## 2016-10-29 NOTE — Patient Instructions (Signed)
Its good to see you and I will see you next Friday!  I sent your higher dose of Prozac 40mg  to the pharmacy.

## 2016-11-06 ENCOUNTER — Other Ambulatory Visit: Payer: Self-pay | Admitting: Obstetrics and Gynecology

## 2016-11-06 DIAGNOSIS — Z1231 Encounter for screening mammogram for malignant neoplasm of breast: Secondary | ICD-10-CM

## 2016-11-11 ENCOUNTER — Ambulatory Visit (INDEPENDENT_AMBULATORY_CARE_PROVIDER_SITE_OTHER): Payer: BC Managed Care – PPO | Admitting: *Deleted

## 2016-11-11 DIAGNOSIS — R55 Syncope and collapse: Secondary | ICD-10-CM

## 2016-11-11 NOTE — Progress Notes (Signed)
Carelink Summary Report / Loop Recorder 

## 2016-11-12 ENCOUNTER — Telehealth: Payer: Self-pay | Admitting: *Deleted

## 2016-11-12 NOTE — Telephone Encounter (Signed)
Manual transmission received.  Additional tachy episode noted from 11/10/16 at 1522, duration 43sec. Patient called back to state that she remembered that she was leading a meeting and reunion at this time.  She reports that she was asymptomatic with this episode.  Patient is aware that I will call her back if Dr. Ladona Ridgelaylor has any recommendations.  Tachy episode ECGs printed and placed in LINQ folder for review by Dr. Ladona Ridgelaylor.

## 2016-11-12 NOTE — Telephone Encounter (Signed)
Patient returned call.  She reports that she was bitten by a spider three weeks ago and hasn't felt "normal" since then.  On 7/15 she reports she didn't feel well, denies exerting herself, possibly had some dizziness, but denies palpitations or syncope.  Advised I will give episode to Dr. Ladona Ridgelaylor to review and call her back if any additional recommendations.    Attempted to assist patient with sending a manual transmission, but received repeated error codes.  Gave patient phone number for [facility] tech services for further assistance.  She is appreciative and denies additional questions or concerns at this time.

## 2016-11-12 NOTE — Telephone Encounter (Signed)
[facility].  Gave direct phone number.  Will determine patient symptomatic with tachy episode on 11/10/16 at 1514, duration 3min 18sec, appears ?SVT.  Will also request that patient send a manual transmission from her [facility] monitor for review of any additional episodes.

## 2016-11-18 LAB — CUP PACEART REMOTE DEVICE CHECK
Implantable Pulse Generator Implant Date: 20160923
MDC IDC SESS DTM: 20180716134323

## 2016-11-18 NOTE — Progress Notes (Signed)
[facility] summary report received. Battery status OK. Normal device function. No new symptom episodes or brady episodes. No new AF episodes. 2 tachy- 1 w/ ECG appears ST, 1 pause- ECG appears ventricular undersensing. Monthly summary reports and ROV/PRN

## 2016-11-19 ENCOUNTER — Ambulatory Visit: Payer: BC Managed Care – PPO

## 2016-11-21 ENCOUNTER — Other Ambulatory Visit: Payer: Self-pay | Admitting: Obstetrics and Gynecology

## 2016-11-21 DIAGNOSIS — N6489 Other specified disorders of breast: Secondary | ICD-10-CM

## 2016-11-21 DIAGNOSIS — M7989 Other specified soft tissue disorders: Secondary | ICD-10-CM

## 2016-11-29 ENCOUNTER — Telehealth: Payer: Self-pay | Admitting: Neurology

## 2016-11-29 ENCOUNTER — Telehealth: Payer: Self-pay | Admitting: Cardiology

## 2016-11-29 DIAGNOSIS — R29818 Other symptoms and signs involving the nervous system: Secondary | ICD-10-CM

## 2016-11-29 DIAGNOSIS — R29898 Other symptoms and signs involving the musculoskeletal system: Secondary | ICD-10-CM

## 2016-11-29 MED ORDER — DIAZEPAM 5 MG PO TABS: ORAL_TABLET | ORAL | 0 refills | Status: DC

## 2016-11-29 NOTE — Telephone Encounter (Signed)
She can try increasing the carbidopa-levodopa to 1.5 tablets three times daily.  The weakness may be due to the Parkinson's disease.  I am really not sure what to make of the numbness.  It may be a side effect of the topamax.  Dr. Arbutus Leasat will review when she returns to the office and determine if it warrants a follow up.

## 2016-11-29 NOTE — Telephone Encounter (Signed)
Called pt back and informed her to send a manual transmission with her home monitor before her MRI appointment. Pt verbalized understanding.

## 2016-11-29 NOTE — Telephone Encounter (Signed)
PT called and wanted a call back to advise she is having weakness in her arms and hands

## 2016-11-29 NOTE — Telephone Encounter (Signed)
Spoke with patient and she states right is NOT significantly worse than left. She is reluctantly agreeable to MR. Order entered. Patient needs valium for scan and this has been [facility].

## 2016-11-29 NOTE — Telephone Encounter (Signed)
Spoke with patient - she states she was outside a couple times this week and she overheated. Had episodes of increased tremors and vomiting afterward. She drank a lot of water and slowly got better.  She also currently has a sinus infection and has about 3 days of antibiotics left.  She states weakness in arms/hands started about a week and a half ago. Right worse than left. Not much numbness/tingling but a little on the right. Symptoms present all day but moments where it seems worse. No other symptoms (SOB, etc). She did get concerned when she couldn't open some lids and couldn't push her daughter's wheelchair.  She is still functioning/exercising/boxing but wanted to make us aware.  Please advise.

## 2016-11-29 NOTE — Telephone Encounter (Signed)
Patient called and wanted to know if she was safe to have a MRI with her Medtronic Loop Recorder, the model number, and the serial number. I provided patient with this information and informed her that she is safe to have a MRI.

## 2016-11-29 NOTE — Telephone Encounter (Signed)
Confirm nothing lateralizing and if not, order MRI brain without to make sure not missing something.  If is lateralizing then should go to ER for eval.

## 2016-11-29 NOTE — Telephone Encounter (Signed)
Left message on machine for patient to call back.

## 2016-12-02 ENCOUNTER — Telehealth: Payer: Self-pay | Admitting: Neurology

## 2016-12-02 NOTE — Telephone Encounter (Signed)
Spoke with patient. She was going to see if they had a sooner appt. [facility]. Made her aware that she could call for cancellations but I couldn't get her any sooner at that location.

## 2016-12-02 NOTE — Telephone Encounter (Signed)
Patient called and would like to speak with you regarding her MRI that has been ordered. Please call. Thanks

## 2016-12-02 NOTE — Telephone Encounter (Signed)
PT called back and said it was in regards to her MRI, reason for the 18th appointment is because when PT's are claustrophobic it has to be an open MRI

## 2016-12-05 ENCOUNTER — Ambulatory Visit: Payer: BC Managed Care – PPO

## 2016-12-05 ENCOUNTER — Ambulatory Visit
Admission: RE | Admit: 2016-12-05 | Discharge: 2016-12-05 | Disposition: A | Discharge status: Home / Self Care | Payer: Medicare Other | Source: Ambulatory Visit | Attending: Obstetrics and Gynecology | Admitting: Obstetrics and Gynecology

## 2016-12-05 DIAGNOSIS — N6489 Other specified disorders of breast: Secondary | ICD-10-CM

## 2016-12-05 DIAGNOSIS — M7989 Other specified soft tissue disorders: Secondary | ICD-10-CM

## 2016-12-11 ENCOUNTER — Ambulatory Visit (INDEPENDENT_AMBULATORY_CARE_PROVIDER_SITE_OTHER): Payer: Medicare Other | Admitting: *Deleted

## 2016-12-11 DIAGNOSIS — R55 Syncope and collapse: Secondary | ICD-10-CM | POA: Diagnosis not present

## 2016-12-14 ENCOUNTER — Ambulatory Visit
Admission: RE | Admit: 2016-12-14 | Discharge: 2016-12-14 | Disposition: A | Discharge status: Home / Self Care | Payer: Medicare Other | Source: Ambulatory Visit | Attending: Neurology | Admitting: Neurology

## 2016-12-14 DIAGNOSIS — R29898 Other symptoms and signs involving the musculoskeletal system: Secondary | ICD-10-CM

## 2016-12-14 DIAGNOSIS — R29818 Other symptoms and signs involving the nervous system: Secondary | ICD-10-CM

## 2016-12-16 ENCOUNTER — Telehealth: Payer: Self-pay | Admitting: Neurology

## 2016-12-16 MED ORDER — METHYLPREDNISOLONE 4 MG PO TBPK: ORAL_TABLET | ORAL | 0 refills | Status: DC

## 2016-12-16 NOTE — Telephone Encounter (Signed)
She can come in for toradol injection if she can get here.  Find out first though where last labs (chem) is so I can look at kidney fxn

## 2016-12-16 NOTE — Telephone Encounter (Signed)
Please advise 

## 2016-12-16 NOTE — Telephone Encounter (Signed)
Patient made aware of MR results.   She wanted to let us know that she has had increased issues with headaches lately. She is currently on Topamax 100 mg BID. States she has had constant headache for the past week and a half. Got worse over the weekend and she treated with ibuprofen/tylenol. Still present today.   Please advise.

## 2016-12-16 NOTE — Telephone Encounter (Signed)
Spoke with patient. She can not remember if it was Prednisone she had a reaction to in the past. She is checking with daughter and will let me know.   If not she will try to go to local doctor to get headache shot since she lives in [place]. Her MD did labs a couple weeks ago.

## 2016-12-16 NOTE — Telephone Encounter (Signed)
-----   Message from Octaviano Batty Tat, DO sent at 12/16/2016  7:32 AM EDT ----- Let pt know that nothing acute on her MRI brain

## 2016-12-16 NOTE — Telephone Encounter (Signed)
PT left a message saying she wanted to let Lesly Rubenstein know she does not do well on steroids

## 2016-12-16 NOTE — Telephone Encounter (Signed)
Patient agreeable. RX sent to [facility].

## 2016-12-16 NOTE — Telephone Encounter (Signed)
Try medrol dose Pak and see if it helps.  Tell her r/b/se of steroids

## 2016-12-19 LAB — CUP PACEART REMOTE DEVICE CHECK
Date Time Interrogation Session: 20180815144048
Implantable Pulse Generator Implant Date: 20160923

## 2016-12-19 NOTE — Progress Notes (Signed)
Loop recorder summary report 

## 2016-12-20 ENCOUNTER — Other Ambulatory Visit: Payer: Self-pay | Admitting: Neurology

## 2016-12-23 MED ORDER — FLUOXETINE HCL 40 MG PO CAPS: 40.0000 mg | ORAL_CAPSULE | Freq: Every day | ORAL | 1 refills | Status: DC

## 2017-01-10 ENCOUNTER — Other Ambulatory Visit: Payer: Self-pay | Admitting: Internal Medicine

## 2017-01-10 ENCOUNTER — Ambulatory Visit (INDEPENDENT_AMBULATORY_CARE_PROVIDER_SITE_OTHER): Payer: Medicare Other | Admitting: *Deleted

## 2017-01-10 DIAGNOSIS — R55 Syncope and collapse: Secondary | ICD-10-CM

## 2017-01-10 NOTE — Progress Notes (Signed)
Carelink Summary Report / Loop Recorder 

## 2017-01-14 LAB — CUP PACEART REMOTE DEVICE CHECK
Implantable Pulse Generator Implant Date: 20160923
MDC IDC SESS DTM: 20180914144022

## 2017-01-24 ENCOUNTER — Other Ambulatory Visit: Payer: Self-pay | Admitting: Neurology

## 2017-02-10 ENCOUNTER — Ambulatory Visit (INDEPENDENT_AMBULATORY_CARE_PROVIDER_SITE_OTHER): Payer: Medicare Other | Admitting: *Deleted

## 2017-02-10 DIAGNOSIS — R55 Syncope and collapse: Secondary | ICD-10-CM

## 2017-02-10 NOTE — Progress Notes (Signed)
Carelink Summary Report / Loop Recorder 

## 2017-02-12 LAB — CUP PACEART REMOTE DEVICE CHECK
Date Time Interrogation Session: 20181014153739
Implantable Pulse Generator Implant Date: 20160923
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
## 2017-03-11 ENCOUNTER — Encounter: Payer: Self-pay | Admitting: Neurology

## 2017-03-11 ENCOUNTER — Ambulatory Visit: Payer: Medicare Other | Admitting: Neurology

## 2017-03-11 ENCOUNTER — Ambulatory Visit (INDEPENDENT_AMBULATORY_CARE_PROVIDER_SITE_OTHER): Payer: Medicare Other | Admitting: *Deleted

## 2017-03-11 VITALS — BP 116/70 | HR 62 | Ht 61.5 in | Wt 170.0 lb

## 2017-03-11 DIAGNOSIS — F330 Major depressive disorder, recurrent, mild: Secondary | ICD-10-CM | POA: Insufficient documentation

## 2017-03-11 DIAGNOSIS — G20 Parkinson's disease: Secondary | ICD-10-CM | POA: Diagnosis not present

## 2017-03-11 DIAGNOSIS — R55 Syncope and collapse: Secondary | ICD-10-CM | POA: Diagnosis not present

## 2017-03-11 NOTE — Patient Instructions (Signed)
Take your Parkinson's medications the morning of surgery if you are able.  Avoid phenergan for nausea, ask for zofran instead if needed.  

## 2017-03-11 NOTE — Progress Notes (Signed)
Carelink Summary Report / Loop Recorder 

## 2017-03-12 ENCOUNTER — Telehealth: Payer: Self-pay | Admitting: Neurology

## 2017-03-12 NOTE — Telephone Encounter (Signed)
Patient left a message on voicemail. She did not need anything from us.

## 2017-03-12 NOTE — Telephone Encounter (Signed)
Pt called and said she needs to talk to you and that she was here yesterday

## 2017-03-12 NOTE — Telephone Encounter (Signed)
Left message on machine for patient to call back.

## 2017-03-24 ENCOUNTER — Other Ambulatory Visit: Payer: Self-pay | Admitting: Neurology

## 2017-03-24 DIAGNOSIS — G20A1 Parkinson's disease without dyskinesia, without mention of fluctuations: Secondary | ICD-10-CM

## 2017-03-24 DIAGNOSIS — R270 Ataxia, unspecified: Secondary | ICD-10-CM

## 2017-03-24 DIAGNOSIS — Z5181 Encounter for therapeutic drug level monitoring: Secondary | ICD-10-CM

## 2017-03-24 DIAGNOSIS — R42 Dizziness and giddiness: Secondary | ICD-10-CM

## 2017-03-24 DIAGNOSIS — G20 Parkinson's disease: Secondary | ICD-10-CM

## 2017-03-27 ENCOUNTER — Telehealth: Payer: Self-pay | Admitting: Neurology

## 2017-03-27 NOTE — Telephone Encounter (Signed)
CT report received and given to Dr. Arbutus Leasat.  Dr. Arbutus Leasat please advise.

## 2017-03-27 NOTE — Telephone Encounter (Signed)
Why did she have it done?  We just did an MRI brain on her in august.

## 2017-03-27 NOTE — Telephone Encounter (Signed)
Spoke with patient who states her PCP was concerned about a recent CT she had done. States possible CSF leak and didn't know who she should follow up with re: this. I called to request report from PCP and they are faxing this over.

## 2017-03-27 NOTE — Telephone Encounter (Signed)
According to the report it looks like for chronic sinusitis. I'm not sure why the PCP asked the patient to call here about follow up. Please review CT and advise who should look further into this.

## 2017-03-27 NOTE — Telephone Encounter (Signed)
Pt called in regards to her CT scan and wanted to talk to Dr Tat regarding that

## 2017-03-27 NOTE — Telephone Encounter (Signed)
Patient made aware.
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
## 2017-03-28 LAB — CUP PACEART REMOTE DEVICE CHECK
Date Time Interrogation Session: 20181113161644
MDC IDC PG IMPLANT DT: 20160923

## 2017-04-10 ENCOUNTER — Ambulatory Visit (INDEPENDENT_AMBULATORY_CARE_PROVIDER_SITE_OTHER): Payer: Medicare Other | Admitting: *Deleted

## 2017-04-10 DIAGNOSIS — R55 Syncope and collapse: Secondary | ICD-10-CM

## 2017-04-10 NOTE — Progress Notes (Signed)
Carelink Summary Report / Loop Recorder 

## 2017-04-23 LAB — CUP PACEART REMOTE DEVICE CHECK
Implantable Pulse Generator Implant Date: 20160923
MDC IDC SESS DTM: 20181213171059

## 2017-05-12 ENCOUNTER — Ambulatory Visit (INDEPENDENT_AMBULATORY_CARE_PROVIDER_SITE_OTHER): Payer: Medicare Other | Admitting: *Deleted

## 2017-05-12 DIAGNOSIS — R55 Syncope and collapse: Secondary | ICD-10-CM

## 2017-05-13 NOTE — Progress Notes (Signed)
Carelink Summary Report / Loop Recorder 

## 2017-05-21 LAB — CUP PACEART REMOTE DEVICE CHECK
Date Time Interrogation Session: 20190112174110
Implantable Pulse Generator Implant Date: 20160923

## 2017-06-09 ENCOUNTER — Ambulatory Visit (INDEPENDENT_AMBULATORY_CARE_PROVIDER_SITE_OTHER): Payer: Medicare Other | Admitting: *Deleted

## 2017-06-09 DIAGNOSIS — R55 Syncope and collapse: Secondary | ICD-10-CM

## 2017-06-10 NOTE — Progress Notes (Signed)
Carelink Summary Report / Loop Recorder 

## 2017-06-17 ENCOUNTER — Other Ambulatory Visit: Payer: Self-pay | Admitting: Neurology

## 2017-07-08 LAB — CUP PACEART REMOTE DEVICE CHECK
Date Time Interrogation Session: 20190211220759
Implantable Pulse Generator Implant Date: 20160923
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
## 2017-07-14 ENCOUNTER — Ambulatory Visit (INDEPENDENT_AMBULATORY_CARE_PROVIDER_SITE_OTHER): Payer: Medicare Other | Admitting: *Deleted

## 2017-07-14 DIAGNOSIS — R55 Syncope and collapse: Secondary | ICD-10-CM | POA: Diagnosis not present

## 2017-07-14 NOTE — Progress Notes (Signed)
Carelink Summary Report / Loop Recorder 

## 2017-07-15 ENCOUNTER — Encounter: Payer: Self-pay | Admitting: Neurology

## 2017-07-15 ENCOUNTER — Ambulatory Visit: Payer: Medicare Other | Admitting: Neurology

## 2017-07-15 VITALS — BP 108/70 | HR 70 | Ht 62.5 in | Wt 169.0 lb

## 2017-07-15 DIAGNOSIS — R630 Anorexia: Secondary | ICD-10-CM | POA: Diagnosis not present

## 2017-07-15 DIAGNOSIS — F330 Major depressive disorder, recurrent, mild: Secondary | ICD-10-CM

## 2017-07-15 DIAGNOSIS — G20 Parkinson's disease: Secondary | ICD-10-CM

## 2017-07-15 NOTE — Patient Instructions (Signed)
  [facility]'s [facility] Movement [facility]  The [facility] team know that living well with a movement disorder extends far beyond our clinic walls. We are together with you. Our team is passionate about providing resources to you and your loved ones who are living with Parkinson's disease and movement disorders. Participate in these programs and join our community. These resources are free or low cost!   [facility] is adding:   Innovative educational programs for patients and caregivers.   Support groups for patients and caregivers living with Parkinson's disease.   Parkinson's specific exercise programs.   Custom tailored therapeutic programs that will benefit patient's living with Parkinson's disease.   We are in this together. You can help and contribute to grow these programs and resources in our community. 100% of the funds donated to the Movement Disorders Fund stays right here in our community to support patients and their caregivers.  To make a tax deductible contribution:  -ask for a [facility] Parkinson's envelope in the office today.  - call the Office of Institutional Advancement at [phone].         

## 2017-07-23 ENCOUNTER — Telehealth: Payer: Self-pay | Admitting: Neurology

## 2017-07-23 MED ORDER — TOPIRAMATE 50 MG PO TABS: ORAL_TABLET | ORAL | 1 refills | Status: DC

## 2017-07-23 NOTE — Telephone Encounter (Signed)
If patient is at the pharmacy, call can be transferred to refill team.  1.     Which medications need to be refilled? (please list name of each medication and dose if know) Topiramate 50 MG  2.     Which pharmacy/location (including street and city if local pharmacy) is medication to be sent to? [facility]  3.     Do they need a 30 or 90 day supply? Lmom and did not say the dosage

## 2017-07-23 NOTE — Telephone Encounter (Signed)
RX sent to pharmacy  

## 2017-08-01 ENCOUNTER — Telehealth: Payer: Self-pay | Admitting: Neurology

## 2017-08-01 NOTE — Telephone Encounter (Signed)
That is not a PD symptom (soreness all over) nor is it SE from one of my meds.  Sorry!  I don't have further advice.  Were labs drawn?

## 2017-08-01 NOTE — Telephone Encounter (Signed)
Spoke with patient and she states she has been having body aches for about a week. She thought it might be from boxing class and ignored it, but it isn't going away. She states not cramping, but just a constant soreness. It is all over her body. She notices a little worse at night when she is trying to sleep. She saw PCP to r/o infection and everything is okay. Please advise.

## 2017-08-01 NOTE — Telephone Encounter (Signed)
Patient went to her regular Dr today with body aches and they did blood work and it showed nothing. She is wanted to know if it could be from the Topamax being decreased? Please call

## 2017-08-04 NOTE — Telephone Encounter (Signed)
LMOM (per DPR) letting patient know the information provided by Dr. Arbutus Leasat.

## 2017-08-13 ENCOUNTER — Encounter: Payer: Self-pay | Admitting: Internal Medicine

## 2017-08-14 ENCOUNTER — Ambulatory Visit (INDEPENDENT_AMBULATORY_CARE_PROVIDER_SITE_OTHER): Payer: Medicare Other | Admitting: *Deleted

## 2017-08-14 DIAGNOSIS — R55 Syncope and collapse: Secondary | ICD-10-CM

## 2017-08-15 NOTE — Progress Notes (Signed)
Carelink Summary Report / Loop Recorder 

## 2017-08-19 LAB — CUP PACEART REMOTE DEVICE CHECK
Date Time Interrogation Session: 20190316180905
Implantable Pulse Generator Implant Date: 20160923

## 2017-08-25 ENCOUNTER — Ambulatory Visit: Payer: Medicare Other | Admitting: Internal Medicine

## 2017-08-25 ENCOUNTER — Encounter: Payer: Self-pay | Admitting: Internal Medicine

## 2017-08-25 VITALS — BP 102/58 | HR 66 | Ht 62.0 in | Wt 171.4 lb

## 2017-08-25 DIAGNOSIS — R55 Syncope and collapse: Secondary | ICD-10-CM | POA: Diagnosis not present

## 2017-08-25 LAB — CUP PACEART INCLINIC DEVICE CHECK
Implantable Pulse Generator Implant Date: 20160923
MDC IDC SESS DTM: 20190429125616

## 2017-08-25 NOTE — Patient Instructions (Addendum)
Medication Instructions:  Your physician recommends that you continue on your current medications as directed. Please refer to the Current Medication list given to you today.  Labwork: None ordered.  Testing/Procedures: None ordered.  Follow-Up:  Appointment for January 22, 2018 @ 8:45 am.    Any Other Special Instructions Will Be Listed Below (If Applicable).  If you need a refill on your cardiac medications before your next appointment, please call your pharmacy.
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
## 2017-09-12 LAB — CUP PACEART REMOTE DEVICE CHECK
Date Time Interrogation Session: 20190418180917
MDC IDC PG IMPLANT DT: 20160923

## 2017-09-16 ENCOUNTER — Ambulatory Visit (INDEPENDENT_AMBULATORY_CARE_PROVIDER_SITE_OTHER): Payer: Medicare Other | Admitting: *Deleted

## 2017-09-16 DIAGNOSIS — R55 Syncope and collapse: Secondary | ICD-10-CM | POA: Diagnosis not present

## 2017-09-17 NOTE — Progress Notes (Signed)
Carelink Summary Report / Loop Recorder 

## 2017-09-19 ENCOUNTER — Other Ambulatory Visit: Payer: Self-pay | Admitting: Internal Medicine

## 2017-09-19 DIAGNOSIS — M5030 Other cervical disc degeneration, unspecified cervical region: Secondary | ICD-10-CM

## 2017-09-21 ENCOUNTER — Ambulatory Visit
Admission: RE | Admit: 2017-09-21 | Discharge: 2017-09-21 | Disposition: A | Discharge status: Home / Self Care | Payer: Medicare Other | Source: Ambulatory Visit | Attending: Internal Medicine | Admitting: Internal Medicine

## 2017-09-21 DIAGNOSIS — M5030 Other cervical disc degeneration, unspecified cervical region: Secondary | ICD-10-CM

## 2017-10-13 LAB — CUP PACEART REMOTE DEVICE CHECK
Implantable Pulse Generator Implant Date: 20160923
MDC IDC SESS DTM: 20190521183719

## 2017-10-20 ENCOUNTER — Telehealth: Payer: Self-pay | Admitting: *Deleted

## 2017-10-20 ENCOUNTER — Ambulatory Visit (INDEPENDENT_AMBULATORY_CARE_PROVIDER_SITE_OTHER): Payer: Medicare Other | Admitting: *Deleted

## 2017-10-20 DIAGNOSIS — R55 Syncope and collapse: Secondary | ICD-10-CM

## 2017-10-20 NOTE — Telephone Encounter (Signed)
LMOM requesting call back to [facility].  Gave direct number.  Will request manual [employer] transmission for review.  Received alert for 2 "pause" episodes on ILR--available ECG is false, shows undersensing.  Will review additional ECG when full report received.

## 2017-10-20 NOTE — Progress Notes (Signed)
Carelink Summary Report / Loop Recorder 

## 2017-10-21 ENCOUNTER — Telehealth: Payer: Self-pay

## 2017-10-21 NOTE — Telephone Encounter (Signed)
Pt called you back. I talked her thru a manual transmission for you to look at. I told her you will give her a call back. She wants you to call her on the [phone].

## 2017-10-22 ENCOUNTER — Telehealth: Payer: Self-pay

## 2017-10-22 NOTE — Telephone Encounter (Signed)
Spoke with patient regarding receiving manual transmission. Advised patient the 2 pause episodes noted were false. Patient verbalized understanding and appreciation.

## 2017-10-22 NOTE — Telephone Encounter (Signed)
Attempted to call patient back regarding receiving manual transmission. No Answer, mailbox full. Reviewed 2 "pause" episodes-- ECGs appear false, show undersensing.

## 2017-10-22 NOTE — Telephone Encounter (Signed)
Pt was returning your call. She is available for the rest of the day. She wants you to call her back at [phone]

## 2017-10-22 NOTE — Telephone Encounter (Signed)
See telephone note 10/20/17 

## 2017-10-22 NOTE — Telephone Encounter (Signed)
See telephone note 10/20/17

## 2017-11-14 ENCOUNTER — Other Ambulatory Visit: Payer: Self-pay | Admitting: Obstetrics and Gynecology

## 2017-11-14 DIAGNOSIS — Z1231 Encounter for screening mammogram for malignant neoplasm of breast: Secondary | ICD-10-CM
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
## 2017-11-18 ENCOUNTER — Encounter: Payer: Self-pay | Admitting: Neurology

## 2017-11-18 ENCOUNTER — Ambulatory Visit: Payer: Medicare Other | Admitting: Neurology

## 2017-11-18 VITALS — BP 110/80 | HR 72 | Ht 62.0 in | Wt 171.4 lb

## 2017-11-18 DIAGNOSIS — G20 Parkinson's disease: Secondary | ICD-10-CM | POA: Diagnosis not present

## 2017-11-18 DIAGNOSIS — F330 Major depressive disorder, recurrent, mild: Secondary | ICD-10-CM | POA: Diagnosis not present

## 2017-11-18 MED ORDER — CARBIDOPA-LEVODOPA 25-100 MG PO TABS: 1.0000 | ORAL_TABLET | Freq: Four times a day (QID) | ORAL | 1 refills | Status: DC

## 2017-11-18 NOTE — Patient Instructions (Signed)
increase carbidopa/levodopa 25/100 to 1 tablet at 7am/11am/3pm/7pm.

## 2017-11-21 ENCOUNTER — Ambulatory Visit (INDEPENDENT_AMBULATORY_CARE_PROVIDER_SITE_OTHER): Payer: Medicare Other | Admitting: *Deleted

## 2017-11-21 DIAGNOSIS — R55 Syncope and collapse: Secondary | ICD-10-CM | POA: Diagnosis not present

## 2017-11-24 DIAGNOSIS — I82432 Acute embolism and thrombosis of left popliteal vein: Secondary | ICD-10-CM | POA: Diagnosis not present

## 2017-11-24 DIAGNOSIS — R760 Raised antibody titer: Secondary | ICD-10-CM | POA: Diagnosis not present

## 2017-11-24 NOTE — Progress Notes (Signed)
Carelink Summary Report / Loop Recorder 

## 2017-12-01 DIAGNOSIS — I82432 Acute embolism and thrombosis of left popliteal vein: Secondary | ICD-10-CM

## 2017-12-01 DIAGNOSIS — Z7901 Long term (current) use of anticoagulants: Secondary | ICD-10-CM

## 2017-12-01 DIAGNOSIS — R760 Raised antibody titer: Secondary | ICD-10-CM

## 2017-12-02 LAB — CUP PACEART REMOTE DEVICE CHECK
Date Time Interrogation Session: 20190623194019
Implantable Pulse Generator Implant Date: 20160923

## 2017-12-09 ENCOUNTER — Ambulatory Visit: Payer: Medicare Other

## 2017-12-24 ENCOUNTER — Ambulatory Visit (INDEPENDENT_AMBULATORY_CARE_PROVIDER_SITE_OTHER): Payer: Medicare Other | Admitting: *Deleted

## 2017-12-24 DIAGNOSIS — R55 Syncope and collapse: Secondary | ICD-10-CM

## 2017-12-25 NOTE — Progress Notes (Signed)
Carelink Summary Report / Loop Recorder 

## 2018-01-05 ENCOUNTER — Ambulatory Visit
Admission: RE | Admit: 2018-01-05 | Discharge: 2018-01-05 | Disposition: A | Discharge status: Home / Self Care | Payer: Medicare Other | Source: Ambulatory Visit | Attending: Obstetrics and Gynecology | Admitting: Obstetrics and Gynecology

## 2018-01-05 DIAGNOSIS — Z1231 Encounter for screening mammogram for malignant neoplasm of breast: Secondary | ICD-10-CM

## 2018-01-05 LAB — CUP PACEART REMOTE DEVICE CHECK
Date Time Interrogation Session: 20190726203623
MDC IDC PG IMPLANT DT: 20160923

## 2018-01-09 ENCOUNTER — Telehealth: Payer: Self-pay | Admitting: *Deleted

## 2018-01-09 NOTE — Telephone Encounter (Signed)
LMOM requesting call back to the [facility], gave direct number.  Will request that patient send a manual [facility] transmission for review. Received alert for 2 "pause" episodes--available episode false, ECG appears SR w/undersensing.

## 2018-01-12 ENCOUNTER — Telehealth: Payer: Self-pay | Admitting: Cardiology

## 2018-01-12 NOTE — Telephone Encounter (Signed)
Follow up  ° ° °Patient is returning call.  °

## 2018-01-12 NOTE — Telephone Encounter (Signed)
I helped Barbara Miranda send a manual transmission, successful. She reports that she has felt really bad over the past few weeks, sluggish, low energy, flu-like (no fever). Her PCP has done bloodwork and cannot come up with answers, I advised her that these episodes appear all to be false and her HR (histogram) are normal. She verbalizes understanding and is appreciative.

## 2018-01-12 NOTE — Telephone Encounter (Signed)
Do not need this encounter °

## 2018-01-12 NOTE — Telephone Encounter (Signed)
Patient was returning call from Friday 01-09-2018. I informed her that Device Tech RN needed her to send a manual transmission with her home monitor. Pt verbalized understanding and said she would be home in about 1 hour and 30 minutes and she will do it then.

## 2018-01-13 NOTE — Telephone Encounter (Signed)
Manual transmission received and reviewed. All "pause" episodes appear false--undersensing. See phone note from 01/12/18. Patient is scheduled with Dr. Ladona Ridgelaylor on 01/22/18 to discuss ILR explant.

## 2018-01-19 ENCOUNTER — Other Ambulatory Visit: Payer: Self-pay | Admitting: Neurology

## 2018-01-20 LAB — CUP PACEART REMOTE DEVICE CHECK
Date Time Interrogation Session: 20190828213541
MDC IDC PG IMPLANT DT: 20160923

## 2018-01-22 ENCOUNTER — Encounter: Payer: Self-pay | Admitting: Internal Medicine

## 2018-01-22 ENCOUNTER — Ambulatory Visit: Payer: Medicare Other | Admitting: Internal Medicine

## 2018-01-22 VITALS — BP 116/74 | HR 74 | Ht 61.0 in | Wt 167.6 lb

## 2018-01-22 DIAGNOSIS — R55 Syncope and collapse: Secondary | ICD-10-CM
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
## 2018-01-22 NOTE — Patient Instructions (Addendum)

## 2018-01-23 ENCOUNTER — Telehealth: Payer: Self-pay | Admitting: *Deleted

## 2018-01-23 NOTE — Telephone Encounter (Signed)
Spoke with patient requesting manual transmission to review 2 pause episodes. Patient verbalized no syncope or presyncope. Manual transmission sent and received. Manual transmission reviewed, 2 pause episodes appear SR with undersensing. Patient verbalized understanding and appreciation.

## 2018-01-26 ENCOUNTER — Ambulatory Visit (INDEPENDENT_AMBULATORY_CARE_PROVIDER_SITE_OTHER): Payer: Medicare Other | Admitting: *Deleted

## 2018-01-26 DIAGNOSIS — R55 Syncope and collapse: Secondary | ICD-10-CM

## 2018-01-27 NOTE — Progress Notes (Signed)
Carelink Summary Report / Loop Recorder 

## 2018-01-28 LAB — CUP PACEART REMOTE DEVICE CHECK
Date Time Interrogation Session: 20190930220818
Implantable Pulse Generator Implant Date: 20160923

## 2018-02-20 LAB — CUP PACEART INCLINIC DEVICE CHECK
Implantable Pulse Generator Implant Date: 20160923
MDC IDC SESS DTM: 20190926130212

## 2018-02-24 ENCOUNTER — Telehealth: Payer: Self-pay | Admitting: Neurology

## 2018-02-24 DIAGNOSIS — Z7901 Long term (current) use of anticoagulants: Secondary | ICD-10-CM

## 2018-02-24 DIAGNOSIS — R760 Raised antibody titer: Secondary | ICD-10-CM

## 2018-02-24 DIAGNOSIS — I82432 Acute embolism and thrombosis of left popliteal vein: Secondary | ICD-10-CM

## 2018-02-24 NOTE — Telephone Encounter (Signed)
I think that she should keep medication as is, especially if feeling good right now

## 2018-02-24 NOTE — Telephone Encounter (Signed)
Patient made aware.

## 2018-02-24 NOTE — Telephone Encounter (Signed)
Patient called regarding her Carbidopa Levodopa medication. She would like to decrease from 4 to 3. Please Call. Thanks

## 2018-02-24 NOTE — Telephone Encounter (Signed)
Spoke with patient.  She states she saw her PCP and they changed her diet. She now has more energy and feeling really good. No fatigue. She wanted to know about decreasing Levodopa back to TID from QID. I did tell her that this probably wouldn't have been increased just for fatigue, and if she is feeling great it probably wouldn't make sense to decrease medication, but she would like to try it.  Dr. Arbutus Leas - Please advise.

## 2018-03-02 ENCOUNTER — Ambulatory Visit (INDEPENDENT_AMBULATORY_CARE_PROVIDER_SITE_OTHER): Payer: Medicare Other | Admitting: *Deleted

## 2018-03-02 DIAGNOSIS — R55 Syncope and collapse: Secondary | ICD-10-CM | POA: Diagnosis not present

## 2018-03-02 NOTE — Progress Notes (Signed)
Carelink Summary Report / Loop Recorder 

## 2018-03-10 DIAGNOSIS — M2041 Other hammer toe(s) (acquired), right foot: Secondary | ICD-10-CM | POA: Insufficient documentation

## 2018-03-11 ENCOUNTER — Ambulatory Visit: Payer: Medicare Other | Admitting: Nurse Practitioner

## 2018-03-11 ENCOUNTER — Encounter: Payer: Self-pay | Admitting: Nurse Practitioner

## 2018-03-11 VITALS — BP 124/68 | HR 66 | Ht 61.0 in | Wt 166.0 lb

## 2018-03-11 DIAGNOSIS — R55 Syncope and collapse: Secondary | ICD-10-CM

## 2018-03-11 DIAGNOSIS — R42 Dizziness and giddiness: Secondary | ICD-10-CM | POA: Diagnosis not present

## 2018-03-11 DIAGNOSIS — I10 Essential (primary) hypertension: Secondary | ICD-10-CM | POA: Insufficient documentation

## 2018-03-11 DIAGNOSIS — R079 Chest pain, unspecified: Secondary | ICD-10-CM | POA: Insufficient documentation

## 2018-03-11 NOTE — Progress Notes (Signed)
Electrophysiology Office Note Date: 03/11/2018  ID:  Barbara Miranda, DOB 04-22-1951, MRN [id number]  PCP: Philemon Kingdom, MD Electrophysiologist: Ladona Ridgel  CC: dizzy and lightheaded  Barbara Miranda is a 67 y.o. female seen today for Dr Ladona Ridgel.  She presents today for routine electrophysiology followup.  Since last being seen in our clinic, the patient reports doing relatively well.  She has had 3 episodes of "not feeling right" with clamminess, diaphoresis, dizziness, and lightheadedness.  The most severe episode she had she was standing on a chair cleaning out the refrigerator and awoke on the floor. With each of these episodes, it has taken some time to get back to normal.  She has not used her symptom activator.  She denies chest pain, palpitations, dyspnea, PND, orthopnea, nausea, vomiting, edema, weight gain, or early satiety.  Past Medical History:  Diagnosis Date  . Anxiety   . Complication of anesthesia    "alot of times my BP will be low when I waking up" (01/19/2015)  . Depression   . DVT (deep venous thrombosis) (HCC)   . GERD (gastroesophageal reflux disease)   . Migraine aura, persistent    "a couple times/yr" (01/19/2015)  . Parkinson's disease (HCC)   . Tremor   . Vitamin D deficiency    Past Surgical History:  Procedure Laterality Date  . ANTERIOR CERVICAL DECOMP/DISCECTOMY FUSION  2006   C5-6  . BACK SURGERY    . CARDIAC CATHETERIZATION  ~ 12/2013  . COMBINED HYSTEROSCOPY DIAGNOSTIC / D&C  03/2001  . EP IMPLANTABLE DEVICE N/A 01/20/2015   Procedure: Loop Recorder Insertion;  Surgeon: Marinus Maw, MD;  [facility] INVASIVE CV LAB;  Service: Cardiovascular;  Laterality: N/A;  . KNEE ARTHROSCOPY Right 1995  . LAPAROSCOPIC CHOLECYSTECTOMY  ~ 2008  . NASAL SEPTUM SURGERY  ~ 1985  . ORIF ANKLE FRACTURE Left 01/21/2015   Procedure: OPEN REDUCTION INTERNAL FIXATION (ORIF) MEDIAL MALLEOLUS FRACTURE;  Surgeon: Myrene Galas, MD;  Location: [facility] OR;  Service:  Orthopedics;  Laterality: Left;  . TUBAL LIGATION  1988    Current Outpatient Medications  Medication Sig Dispense Refill  . Biotin 1 MG CAPS Take 1 mg by mouth daily.     . carbidopa-levodopa (SINEMET IR) 25-100 MG tablet Take 1 tablet by mouth 4 (four) times daily. 360 tablet 1  . cholecalciferol (VITAMIN D) 1000 UNITS tablet Take 1,000 Units by mouth 2 (two) times daily.     Marland Kitchen FLUoxetine (PROZAC) 40 MG capsule TAKE ONE CAPSULE BY MOUTH DAILY 90 capsule 1  . fluticasone (FLONASE) 50 MCG/ACT nasal spray Place 1 spray into both nostrils daily.  99  . montelukast (SINGULAIR) 10 MG tablet Take 10 mg by mouth every evening.  4  . pantoprazole (PROTONIX) 40 MG tablet Take 40 mg by mouth daily.    Marland Kitchen topiramate (TOPAMAX) 50 MG tablet TAKE 2 TABLETS BY MOUTH EVERY DAY IN THE MORNING and TAKE ONE TABLET BY MOUTH IN THE EVENING 270 tablet 1   No current facility-administered medications for this visit.     Allergies:   Mirapex [pramipexole dihydrochloride]; Other; Levaquin  [levofloxacin]; Penicillins; Prednisone; and Propranolol   Social History: Social History   Socioeconomic History  . Marital status: Divorced    Spouse name: Not on file  . Number of children: Not on file  . Years of education: Not on file  . Highest education level: Not on file  Occupational History  . Not on file  Social  Needs  . Financial resource strain: Not on file  . Food insecurity:    Worry: Not on file    Inability: Not on file  . Transportation needs:    Medical: Not on file    Non-medical: Not on file  Tobacco Use  . Smoking status: Never Smoker  . Smokeless tobacco: Never Used  Substance and Sexual Activity  . Alcohol use: No    Alcohol/week: 0.0 standard drinks  . Drug use: No  . Sexual activity: Never  Lifestyle  . Physical activity:    Days per week: Not on file    Minutes per session: Not on file  . Stress: Not on file  Relationships  . Social connections:    Talks on phone: Not on file     Gets together: Not on file    Attends religious service: Not on file    Active member of club or organization: Not on file    Attends meetings of clubs or organizations: Not on file    Relationship status: Not on file  . Intimate partner violence:    Fear of current or ex partner: Not on file    Emotionally abused: Not on file    Physically abused: Not on file    Forced sexual activity: Not on file  Other Topics Concern  . Not on file  Social History Narrative  . Not on file    Family History: Family History  Problem Relation Age of Onset  . Stroke Mother   . Cancer Father   . Breast cancer Sister   . Cancer Sister   . Cancer Brother   . Breast cancer Paternal Aunt     Review of Systems: All other systems reviewed and are otherwise negative except as noted above.   Physical Exam: VS:  BP 124/68   Pulse 66   Ht 5\' 1"  (1.549 m)   Wt 166 lb (75.3 kg)   SpO2 99%   BMI 31.37 kg/m  , BMI Body mass index is 31.37 kg/m. Wt Readings from Last 3 Encounters:  03/11/18 166 lb (75.3 kg)  01/22/18 167 lb 9.6 oz (76 kg)  11/18/17 171 lb 6 oz (77.7 kg)    GEN- The patient is well appearing, alert and oriented x 3 today.   HEENT: normocephalic, atraumatic; sclera clear, conjunctiva pink; hearing intact; oropharynx clear; neck supple  Lungs- Clear to ausculation bilaterally, normal work of breathing.  No wheezes, rales, rhonchi Heart- Regular rate and rhythm  GI- soft, non-tender, non-distended, bowel sounds present  Extremities- no clubbing, cyanosis, or edema  MS- no significant deformity or atrophy Skin- warm and dry, no rash or lesion  Psych- euthymic mood, full affect Neuro- strength and sensation are intact   EKG:  EKG is not ordered today.   Recent Labs: No results found for requested labs within last 8760 hours.    Other studies Reviewed: Additional studies/ records that were reviewed today include: Dr Lubertha Basque office notes   Assessment and Plan:  1.   Syncope No arrhthymias to date on ILR interrogation Continue to follow  2.  Dizziness/lightheadedness No arrhythmias Sounds vasovagal in nature by history I have encouraged her to use symptom activator Encouraged adequate hydration, compression hose, abdominal binder   Current medicines are reviewed at length with the patient today.   The patient does not have concerns regarding her medicines.  The following changes were made today:  none  Labs/ tests ordered today include: none No orders of  the defined types were placed in this encounter.    Disposition:   Follow up with me in 6-8 weeks   Signed, Gypsy BalsamAmber Tomislav Micale, NP 03/11/2018 10:21 AM   [facility] [address] [place] [address] [phone] (office) [phone] (fax)

## 2018-03-11 NOTE — Patient Instructions (Signed)
Medication Instructions:  NONE ORDERED  TODAY  If you need a refill on your cardiac medications before your next appointment, please call your pharmacy.   Lab work: NONE ORDERED  TODAY  If you have labs (blood work) drawn today and your tests are completely normal, you will receive your results only by: Marland Kitchen. MyChart Message (if you have MyChart) OR . A paper copy in the mail If you have any lab test that is abnormal or we need to change your treatment, we will call you to review the results.  Testing/Procedures: NONE ORDERED  TODAY   Follow-Up: At [facility], you and your health needs are our priority.  As part of our continuing mission to provide you with exceptional heart care, we have created designated Provider Care Teams.  These Care Teams include your primary Cardiologist (physician) and Advanced Practice Providers (APPs -  Physician Assistants and Nurse Practitioners) who all work together to provide you with the care you need, when you need it. You will need a follow up appointment in 6-8  weeks with Barbara BalsamAmber Seiler, NP or Barbara Dowseenee Ursuy, PA-C  Any Other Special Instructions Will Be Listed Below (If Applicable).  INCREASE FLUID INTAKE   INCREASE SALT INTAKE SLIGHTLY   TRY COMPRESSION HOSE  OR ABDOMINAL BINDER

## 2018-03-24 LAB — CUP PACEART REMOTE DEVICE CHECK
Implantable Pulse Generator Implant Date: 20160923
MDC IDC SESS DTM: 20191102221012

## 2018-04-02 ENCOUNTER — Ambulatory Visit (INDEPENDENT_AMBULATORY_CARE_PROVIDER_SITE_OTHER): Payer: Medicare Other

## 2018-04-02 DIAGNOSIS — R55 Syncope and collapse: Secondary | ICD-10-CM | POA: Diagnosis not present

## 2018-04-06 NOTE — Progress Notes (Signed)
Carelink Summary Report / Loop Recorder 
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
## 2018-04-10 ENCOUNTER — Encounter: Payer: Self-pay | Admitting: Neurology

## 2018-04-10 ENCOUNTER — Ambulatory Visit: Payer: Medicare Other | Admitting: Neurology

## 2018-04-10 VITALS — BP 130/80 | HR 72 | Ht 61.0 in | Wt 169.1 lb

## 2018-04-10 DIAGNOSIS — G20 Parkinson's disease: Secondary | ICD-10-CM

## 2018-04-10 DIAGNOSIS — F330 Major depressive disorder, recurrent, mild: Secondary | ICD-10-CM | POA: Diagnosis not present

## 2018-04-10 NOTE — Patient Instructions (Signed)
You look really good today.  Keep up the hard work!  Merry Christmas!

## 2018-04-16 ENCOUNTER — Telehealth: Payer: Self-pay

## 2018-04-16 NOTE — Telephone Encounter (Signed)
I called the patient to let her know that her battery has reached RRT since December 13. She said she would discuss taking it out with Amber when she comes for her January appointment.

## 2018-04-17 ENCOUNTER — Encounter: Payer: Self-pay | Admitting: Nurse Practitioner

## 2018-04-17 NOTE — Telephone Encounter (Signed)
Appointment notes updated for 05/15/18 OV with Gypsy BalsamAmber Seiler, NP. [facility].

## 2018-04-20 ENCOUNTER — Telehealth: Payer: Self-pay

## 2018-04-20 NOTE — Telephone Encounter (Signed)
Called and left patient a message asking her to send manual transmission for LINQ ILR. Advised patient if she needs help and step by step instructions to call me back.

## 2018-04-23 NOTE — Telephone Encounter (Signed)
Left pt a message that we still hadn't received her transmission for her LINQ, and to go ahead and get that sent in. If she has any questions, to call.

## 2018-04-24 NOTE — Telephone Encounter (Signed)
3rd attempt   LMOVM for pt to return call.  

## 2018-04-27 NOTE — Telephone Encounter (Signed)
Spoke w/ pt and instructed her how to send a manual transmission w/ her home monitor. Transmission received. I informed her that the RN will review and call back if anything is abnormal. Pt is aware of RRT and her appt w/ EP APP to discuss the loop recorder reaching RRT and her options.

## 2018-05-05 ENCOUNTER — Ambulatory Visit (INDEPENDENT_AMBULATORY_CARE_PROVIDER_SITE_OTHER): Payer: Medicare Other

## 2018-05-05 DIAGNOSIS — R55 Syncope and collapse: Secondary | ICD-10-CM

## 2018-05-06 LAB — CUP PACEART REMOTE DEVICE CHECK
Date Time Interrogation Session: 20200107230628
Implantable Pulse Generator Implant Date: 20160923

## 2018-05-07 NOTE — Progress Notes (Signed)
Carelink Summary Report / Loop Recorder 

## 2018-05-13 ENCOUNTER — Encounter: Payer: Medicare Other | Admitting: Nurse Practitioner

## 2018-05-17 LAB — CUP PACEART REMOTE DEVICE CHECK
Date Time Interrogation Session: 20191205223707
Implantable Pulse Generator Implant Date: 20160923

## 2018-05-18 ENCOUNTER — Other Ambulatory Visit: Payer: Self-pay | Admitting: Neurology

## 2018-05-18 ENCOUNTER — Ambulatory Visit: Payer: Medicare Other | Admitting: Neurology

## 2018-06-02 ENCOUNTER — Other Ambulatory Visit: Payer: Self-pay | Admitting: Internal Medicine

## 2018-06-08 ENCOUNTER — Telehealth: Payer: Self-pay | Admitting: Neurology

## 2018-06-08 NOTE — Telephone Encounter (Signed)
Tell her she can take an extra prn but maybe not every day.  I bet it gets better with UTI tx and lowering stress

## 2018-06-08 NOTE — Telephone Encounter (Signed)
Patient made aware.

## 2018-06-08 NOTE — Telephone Encounter (Signed)
Patient called and is needing to speak with you regarding her Sinemet medication. She said she took 2 yesterday and 2 today. She said she felt better after doing that.  She said she had a rough time last week. Please Call. Thanks

## 2018-06-08 NOTE — Telephone Encounter (Signed)
Spoke with patient, she states she had a UTI last week and sister is very sick (having a lot of stress) and her symptoms have been worse (lots of tremors). She took an extra levodopa a day for the last two days. Has taken one tablet 5 times daily instead of 4 times daily every 3.5 hours. She has felt better on this dosage. I did explain that you often don't adjust dosage due to increased symptoms from UTI, but she would like to take the extra tablet daily if she could. Please advise.

## 2018-06-18 ENCOUNTER — Ambulatory Visit: Payer: Medicare Other | Admitting: Cardiology

## 2018-07-17 ENCOUNTER — Other Ambulatory Visit: Payer: Self-pay | Admitting: Neurology

## 2018-07-23 ENCOUNTER — Telehealth: Payer: Self-pay | Admitting: Internal Medicine

## 2018-07-23 NOTE — Telephone Encounter (Signed)
Pt wanted to know if it was ok to push back her appt to remove her device. She is okay with waiting if necessary. Please advise

## 2018-07-23 NOTE — Telephone Encounter (Signed)
Returned call to Pt.  Rescheduled loop removal for September.  Sent MyChart sign up.

## 2018-07-30 ENCOUNTER — Encounter: Payer: Medicare Other | Admitting: Internal Medicine
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
## 2018-09-14 ENCOUNTER — Telehealth (INDEPENDENT_AMBULATORY_CARE_PROVIDER_SITE_OTHER): Payer: Medicare Other | Admitting: Neurology

## 2018-09-14 ENCOUNTER — Other Ambulatory Visit: Payer: Self-pay

## 2018-09-14 ENCOUNTER — Encounter: Payer: Self-pay | Admitting: Neurology

## 2018-09-14 DIAGNOSIS — G20 Parkinson's disease: Secondary | ICD-10-CM | POA: Diagnosis not present

## 2018-10-13 ENCOUNTER — Ambulatory Visit: Payer: Medicare Other | Admitting: Neurology

## 2018-11-09 ENCOUNTER — Telehealth: Payer: Self-pay | Admitting: Neurology

## 2018-11-09 NOTE — Telephone Encounter (Signed)
Called patient to make her aware no answer left message ok to reduce Rx

## 2018-11-09 NOTE — Telephone Encounter (Signed)
Pt currently Topamax 50 mg 1 in AM 2 in PM Pt states there is no problems she is requesting to reduce medication because she is no longer having headaches  Requesting to reduce to  Topamax 50 MG 1 in AM 1 in PM  Please advise

## 2018-11-09 NOTE — Telephone Encounter (Signed)
Patient is calling in wanting to go down to 2 topamax a day instead of the 3. Please let her know if this is okay. Thanks!

## 2018-11-09 NOTE — Telephone Encounter (Signed)
ok 

## 2018-11-24 ENCOUNTER — Encounter: Payer: Self-pay | Admitting: Orthopaedic Surgery

## 2018-11-24 ENCOUNTER — Other Ambulatory Visit: Payer: Self-pay

## 2018-11-24 ENCOUNTER — Ambulatory Visit: Payer: Self-pay

## 2018-11-24 ENCOUNTER — Ambulatory Visit: Payer: Medicare Other | Admitting: Orthopaedic Surgery

## 2018-11-24 VITALS — BP 104/72 | HR 77 | Resp 16 | Ht 61.5 in | Wt 164.0 lb

## 2018-11-24 DIAGNOSIS — M25562 Pain in left knee: Secondary | ICD-10-CM | POA: Insufficient documentation

## 2018-11-24 MED ORDER — METHYLPREDNISOLONE ACETATE 40 MG/ML IJ SUSP
80.0000 mg | INTRAMUSCULAR | Status: AC | PRN
  Administered 2018-11-24: 11:00:00 80 mg via INTRA_ARTICULAR

## 2018-11-24 MED ORDER — LIDOCAINE HCL 1 % IJ SOLN
2.0000 mL | INTRAMUSCULAR | Status: AC | PRN
  Administered 2018-11-24: 2 mL

## 2018-11-24 MED ORDER — BUPIVACAINE HCL 0.5 % IJ SOLN
2.0000 mL | INTRAMUSCULAR | Status: AC | PRN
  Administered 2018-11-24: 2 mL via INTRA_ARTICULAR
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
## 2018-11-25 ENCOUNTER — Other Ambulatory Visit: Payer: Self-pay | Admitting: Neurology

## 2018-11-26 NOTE — Telephone Encounter (Signed)
Requested Prescriptions   Pending Prescriptions Disp Refills  . carbidopa-levodopa (SINEMET IR) 25-100 MG tablet [Pharmacy Med Name: carbidopa 25 mg-levodopa 100 mg tablet] 360 tablet 1    Sig: TAKE ONE TABLET BY MOUTH 4 TIMES DAILY   Rx last filled:05/18/18 #360 1 refills  Pt last seen: 09/14/18   Follow up appt scheduled:none

## 2018-12-03 ENCOUNTER — Other Ambulatory Visit: Payer: Self-pay | Admitting: Obstetrics and Gynecology

## 2018-12-03 DIAGNOSIS — Z1231 Encounter for screening mammogram for malignant neoplasm of breast: Secondary | ICD-10-CM

## 2018-12-07 ENCOUNTER — Telehealth: Payer: Self-pay | Admitting: Orthopaedic Surgery

## 2018-12-07 NOTE — Telephone Encounter (Signed)
Please advise 

## 2018-12-07 NOTE — Telephone Encounter (Signed)
Patient called stating Dr. Durward Fortes gave her a cortisone injection in her left knee two weeks ago.  Patient states the injection did not help with her pain symptoms and is requesting an MRI.

## 2018-12-08 ENCOUNTER — Other Ambulatory Visit: Payer: Self-pay | Admitting: Orthopaedic Surgery

## 2018-12-08 DIAGNOSIS — M25562 Pain in left knee: Secondary | ICD-10-CM

## 2018-12-08 NOTE — Telephone Encounter (Signed)
Ok to order MRI left knee

## 2018-12-08 NOTE — Telephone Encounter (Signed)
Spoke with patient. MRI ordered. Patient was advised to call her PCP to see how much Ibuprofen she can safely take.

## 2018-12-29 ENCOUNTER — Encounter: Payer: Self-pay | Admitting: Internal Medicine

## 2018-12-29 ENCOUNTER — Other Ambulatory Visit: Payer: Self-pay

## 2018-12-29 ENCOUNTER — Ambulatory Visit (INDEPENDENT_AMBULATORY_CARE_PROVIDER_SITE_OTHER): Payer: Medicare Other | Admitting: Internal Medicine

## 2018-12-29 VITALS — BP 100/72 | HR 78 | Ht 61.5 in | Wt 170.0 lb

## 2018-12-29 DIAGNOSIS — R55 Syncope and collapse: Secondary | ICD-10-CM
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
## 2018-12-29 NOTE — Patient Instructions (Addendum)
Medication Instructions:  Your physician recommends that you continue on your current medications as directed. Please refer to the Current Medication list given to you today.  Labwork: None ordered.  Testing/Procedures: None ordered.  Follow-Up:  Your physician wants you to follow-up in: one year with Dr. Lovena Le.   You will receive a reminder letter in the mail two months in advance. If you don't receive a letter, please call our office to schedule the follow-up appointment.    Implantable Loop Recorder Placement, Care After Refer to this sheet in the next few weeks. These instructions provide you with information about caring for yourself after your procedure. Your health care provider may also give you more specific instructions. Your treatment has been planned according to current medical practices, but problems sometimes occur. Call your health care provider if you have any problems or questions after your procedure. What can I expect after the procedure? After the procedure, it is common to have:  Soreness or pain near the cut from surgery (incision).  Some swelling or bruising near the incision.  Follow these instructions at home:  Medicines  Take over-the-counter and prescription medicines only as told by your health care provider.  If you were prescribed an antibiotic medicine, take it as told by your health care provider. Do not stop taking the antibiotic even if you start to feel better.  Bathing Do not take baths, swim, or use a hot tub until your health care provider approves. You may shower 24 hours after removal of your monitor.  Incision care  Follow instructions from your health care provider about how to take care of your incision. Make sure you: ? Remove your top dressing after 24 hours (before you shower) ? Leave stitches (sutures), skin glue, or adhesive strips in place. These skin closures may need to stay in place for 2 weeks or longer. If adhesive strip  edges start to loosen and curl up, you may trim the loose edges. Do not remove adhesive strips completely unless your health care provider tells you to do that.  Check your incision area every day for signs of infection. Check for: ? More redness, swelling, or pain. ? Fluid or blood. ? Warmth. ? Pus or a bad smell.  Contact a health care provider if:  You have more redness, swelling, or pain around your incision.  You have more fluid or blood coming from your incision.  Your incision feels warm to the touch.  You have pus or a bad smell coming from your incision.  You have a fever.  You have pain that is not relieved by your pain medicine.  You have triggered your device because of fainting (syncope) or because of a heartbeat that feels like it is racing, slow, fluttering, or skipping (palpitations).

## 2019-01-13 ENCOUNTER — Inpatient Hospital Stay: Admission: RE | Admit: 2019-01-13 | Payer: Medicare Other | Source: Ambulatory Visit

## 2019-01-13 ENCOUNTER — Other Ambulatory Visit: Payer: Self-pay | Admitting: Obstetrics and Gynecology

## 2019-01-13 ENCOUNTER — Ambulatory Visit: Payer: Medicare Other

## 2019-01-13 DIAGNOSIS — Z1231 Encounter for screening mammogram for malignant neoplasm of breast: Secondary | ICD-10-CM

## 2019-01-14 ENCOUNTER — Other Ambulatory Visit: Payer: Medicare Other

## 2019-02-18 ENCOUNTER — Encounter: Payer: Self-pay | Admitting: Cardiology

## 2019-02-18 ENCOUNTER — Other Ambulatory Visit: Payer: Self-pay

## 2019-02-18 ENCOUNTER — Ambulatory Visit (INDEPENDENT_AMBULATORY_CARE_PROVIDER_SITE_OTHER): Payer: Medicare Other | Admitting: Cardiology

## 2019-02-18 VITALS — BP 130/82 | HR 65 | Ht 61.5 in | Wt 173.0 lb

## 2019-02-18 DIAGNOSIS — R079 Chest pain, unspecified: Secondary | ICD-10-CM | POA: Diagnosis not present

## 2019-02-18 DIAGNOSIS — Z01812 Encounter for preprocedural laboratory examination: Secondary | ICD-10-CM | POA: Diagnosis not present

## 2019-02-18 DIAGNOSIS — R0600 Dyspnea, unspecified: Secondary | ICD-10-CM | POA: Diagnosis not present

## 2019-02-18 DIAGNOSIS — R5383 Other fatigue: Secondary | ICD-10-CM | POA: Diagnosis not present

## 2019-02-18 MED ORDER — NITROGLYCERIN 0.4 MG SL SUBL: 0.4000 mg | SUBLINGUAL_TABLET | SUBLINGUAL | 3 refills | Status: DC | PRN

## 2019-02-18 MED ORDER — METOPROLOL TARTRATE 50 MG PO TABS: ORAL_TABLET | ORAL | 0 refills | Status: DC

## 2019-02-18 NOTE — Patient Instructions (Signed)
Medication Instructions:  Your physician has recommended you make the following change in your medication:   Nitroglycerin 0.4 mg sublingual (under your tongue) as needed for chest pain. If experiencing chest pain, stop what you are doing and sit down. Take 1 nitroglycerin and wait 5 minutes. If chest pain continues, take another nitroglycerin and wait 5 minutes. If chest pain does not subside, take 1 more nitroglycerin and dial 911. You make take a total of 3 nitroglycerin in a 15 minute time frame.   *If you need a refill on your cardiac medications before your next appointment, please call your pharmacy*  Lab Work: Your physician recommends that you return for lab work in: TODAY BMP,CBC,TSH,Magnesium,  3-7 days prior to CT : BMP  If you have labs (blood work) drawn today and your tests are completely normal, you will receive your results only by: Marland Kitchen. MyChart Message (if you have MyChart) OR . A paper copy in the mail If you have any lab test that is abnormal or we need to change your treatment, we will call you to review the results.  Testing/Procedures: Your physician has requested that you have an echocardiogram. Echocardiography is a painless test that uses sound waves to create images of your heart. It provides your doctor with information about the size and shape of your heart and how well your heart's chambers and valves are working. This procedure takes approximately one hour. There are no restrictions for this procedure.  Your physician has requested that you have cardiac CT. Cardiac computed tomography (CT) is a painless test that uses an x-ray machine to take clear, detailed pictures of your heart. For further information please visit [email or web address] Please follow instruction sheet as given.  Your cardiac CT will be scheduled at one of the below locations:   [facility] [address] [place], [place] [address] [phone]  If scheduled at [facility], please arrive at the [facility] main entrance of [facility] 30-45 minutes prior to test start time. Proceed to the [facility] Radiology Department (first floor) to check-in and test prep.  Please follow these instructions carefully (unless otherwise directed):   On the Night Before the Test: . Be sure to Drink plenty of water. . Do not consume any caffeinated/decaffeinated beverages or chocolate 12 hours prior to your test. . Do not take any antihistamines 12 hours prior to your test.  On the Day of the Test: . Drink plenty of water. Do not drink any water within one hour of the test. . Do not eat any food 4 hours prior to the test. . You may take your regular medications prior to the test.  . Take metoprolol (Lopressor) two hours prior to test. . HOLD Furosemide/Hydrochlorothiazide morning of the test. . FEMALES- please wear underwire-free bra if available                 -If HR is less than 55 BPM- No Beta Blocker                -IF HR is greater than 55 BPM and patient is less than or equal to 67 yrs old Lopressor 100mg  x1.   After the Test: . Drink plenty of water. . After receiving IV contrast, you may experience a mild flushed feeling. This is normal. . On occasion, you may experience a mild rash up to 24 hours after the test. This is not dangerous. If this occurs, you can take Benadryl  25 mg and increase your fluid intake. . If you experience trouble breathing, this can be serious. If it is severe call 911 IMMEDIATELY. If it is mild, please call our office.   Once we have confirmed authorization from your insurance company, we will call you to set up a date and time for your test.   For non-scheduling related questions, please contact the cardiac imaging nurse navigator should you have any questions/concerns: Rockwell Alexandria, RN Navigator Cardiac Imaging [facility] [phone] Office    Follow-Up: At [facility], you and  your health needs are our priority.  As part of our continuing mission to provide you with exceptional heart care, we have created designated Provider Care Teams.  These Care Teams include your primary Cardiologist (physician) and Advanced Practice Providers (APPs -  Physician Assistants and Nurse Practitioners) who all work together to provide you with the care you need, when you need it.  Your next appointment:   3 months  The format for your next appointment:   In Person  Provider:   Thomasene Ripple, DO  Other Instructions   Echocardiogram An echocardiogram is a procedure that uses painless sound waves (ultrasound) to produce an image of the heart. Images from an echocardiogram can provide important information about:  Signs of coronary artery disease (CAD).  Aneurysm detection. An aneurysm is a weak or damaged part of an artery wall that bulges out from the normal force of blood pumping through the body.  Heart size and shape. Changes in the size or shape of the heart can be associated with certain conditions, including heart failure, aneurysm, and CAD.  Heart muscle function.  Heart valve function.  Signs of a past heart attack.  Fluid buildup around the heart.  Thickening of the heart muscle.  A tumor or infectious growth around the heart valves. Tell a health care provider about:  Any allergies you have.  All medicines you are taking, including vitamins, herbs, eye drops, creams, and over-the-counter medicines.  Any blood disorders you have.  Any surgeries you have had.  Any medical conditions you have.  Whether you are pregnant or may be pregnant. What are the risks? Generally, this is a safe procedure. However, problems may occur, including:  Allergic reaction to dye (contrast) that may be used during the procedure. What happens before the procedure? No specific preparation is needed. You may eat and drink normally. What happens during the procedure?   An  IV tube may be inserted into one of your veins.  You may receive contrast through this tube. A contrast is an injection that improves the quality of the pictures from your heart.  A gel will be applied to your chest.  A wand-like tool (transducer) will be moved over your chest. The gel will help to transmit the sound waves from the transducer.  The sound waves will harmlessly bounce off of your heart to allow the heart images to be captured in real-time motion. The images will be recorded on a computer. The procedure may vary among health care providers and hospitals. What happens after the procedure?  You may return to your normal, everyday life, including diet, activities, and medicines, unless your health care provider tells you not to do that. Summary  An echocardiogram is a procedure that uses painless sound waves (ultrasound) to produce an image of the heart.  Images from an echocardiogram can provide important information about the size and shape of your heart, heart muscle  function, heart valve function, and fluid buildup around your heart.  You do not need to do anything to prepare before this procedure. You may eat and drink normally.  After the echocardiogram is completed, you may return to your normal, everyday life, unless your health care provider tells you not to do that. This information is not intended to replace advice given to you by your health care provider. Make sure you discuss any questions you have with your health care provider. Document Released: 04/12/2000 Document Revised: 08/06/2018 Document Reviewed: 05/18/2016 Elsevier Patient Education  2020 [employer]  Cardiac CT Angiogram  A cardiac CT angiogram is a procedure to look at the heart and the area around the heart. It may be done to help find the cause of chest pains or other symptoms of heart disease. During this procedure, a large X-ray machine, called a CT scanner, takes detailed pictures of the heart  and the surrounding area after a dye (contrast material) has been injected into blood vessels in the area. The procedure is also sometimes called a coronary CT angiogram, coronary artery scanning, or CTA. A cardiac CT angiogram allows the health care provider to see how well blood is flowing to and from the heart. The health care provider will be able to see if there are any problems, such as:  Blockage or narrowing of the coronary arteries in the heart.  Fluid around the heart.  Signs of weakness or disease in the muscles, valves, and tissues of the heart. Tell a health care provider about:  Any allergies you have. This is especially important if you have had a previous allergic reaction to contrast dye.  All medicines you are taking, including vitamins, herbs, eye drops, creams, and over-the-counter medicines.  Any blood disorders you have.  Any surgeries you have had.  Any medical conditions you have.  Whether you are pregnant or may be pregnant.  Any anxiety disorders, chronic pain, or other conditions you have that may increase your stress or prevent you from lying still. What are the risks? Generally, this is a safe procedure. However, problems may occur, including:  Bleeding.  Infection.  Allergic reactions to medicines or dyes.  Damage to other structures or organs.  Kidney damage from the dye or contrast that is used.  Increased risk of cancer from radiation exposure. This risk is low. Talk with your health care provider about: ? The risks and benefits of testing. ? How you can receive the lowest dose of radiation. What happens before the procedure?  Wear comfortable clothing and remove any jewelry, glasses, dentures, and hearing aids.  Follow instructions from your health care provider about eating and drinking. This may include: ? For 12 hours before the test - avoid caffeine. This includes tea, coffee, soda, energy drinks, and diet pills. Drink plenty of water  or other fluids that do not have caffeine in them. Being well-hydrated can prevent complications. ? For 4-6 hours before the test - stop eating and drinking. The contrast dye can cause nausea, but this is less likely if your stomach is empty.  Ask your health care provider about changing or stopping your regular medicines. This is especially important if you are taking diabetes medicines, blood thinners, or medicines to treat erectile dysfunction. What happens during the procedure?  Hair on your chest may need to be removed so that small sticky patches called electrodes can be placed on your chest. These will transmit information that helps to monitor your heart during  the test.  An IV tube will be inserted into one of your veins.  You might be given a medicine to control your heart rate during the test. This will help to ensure that good images are obtained.  You will be asked to lie on an exam table. This table will slide in and out of the CT machine during the procedure.  Contrast dye will be injected into the IV tube. You might feel warm, or you may get a metallic taste in your mouth.  You will be given a medicine (nitroglycerin) to relax (dilate) the arteries in your heart.  The table that you are lying on will move into the CT machine tunnel for the scan.  The person running the machine will give you instructions while the scans are being done. You may be asked to: ? Keep your arms above your head. ? Hold your breath. ? Stay very still, even if the table is moving.  When the scanning is complete, you will be moved out of the machine.  The IV tube will be removed. The procedure may vary among health care providers and hospitals. What happens after the procedure?  You might feel warm, or you may get a metallic taste in your mouth from the contrast dye.  You may have a headache from the nitroglycerin.  After the procedure, drink water or other fluids to wash (flush) the contrast  material out of your body.  Contact a health care provider if you have any symptoms of allergy to the contrast. These symptoms include: ? Shortness of breath. ? Rash or hives. ? A racing heartbeat.  Most people can return to their normal activities right after the procedure. Ask your health care provider what activities are safe for you.  It is up to you to get the results of your procedure. Ask your health care provider, or the department that is doing the procedure, when your results will be ready. Summary  A cardiac CT angiogram is a procedure to look at the heart and the area around the heart. It may be done to help find the cause of chest pains or other symptoms of heart disease.  During this procedure, a large X-ray machine, called a CT scanner, takes detailed pictures of the heart and the surrounding area after a dye (contrast material) has been injected into blood vessels in the area.  Ask your health care provider about changing or stopping your regular medicines before the procedure. This is especially important if you are taking diabetes medicines, blood thinners, or medicines to treat erectile dysfunction.  After the procedure, drink water or other fluids to wash (flush) the contrast material out of your body. This information is not intended to replace advice given to you by your health care provider. Make sure you discuss any questions you have with your health care provider. Document Released: 03/28/2008 Document Revised: 03/28/2017 Document Reviewed: 03/04/2016 Elsevier Patient Education  2020 [employer].

## 2019-02-18 NOTE — Progress Notes (Signed)
Cardiology Office Note:    Date:  02/18/2019   ID:  Barbara Miranda, DOB February 26, 1951, MRN [id number]  PCP:  Philemon KingdomProchnau, [name], MD  Cardiologist:  No primary care provider on file.  Electrophysiologist:  None   Referring MD: Philemon KingdomProchnau, [name], MD   Chief Complaint  Patient presents with  . Chest Pain    onset 2 weeks    History of Present Illness:    Barbara Miranda is a 68 y.o. female with a hx of DVT with most recent recurrent clot next week ago (on Xarelto), history of syncope and is status post ILR implantation and remova  Per records she did not have any bradycardia or tachyarrhythmias.  Patient is here today because she has been experiencing intermittent shortness of breath and chest pain.  She stated that her chest pain is midsternal nonradiating about a 5 out of 10 and last for about 10 to 15 minutes with resolution.  She states this started a couple months ago.  The first time she says she experienced significant shortness of breath with his pain he therefore decided to go to urgent care but due to shortness of breath she was asked to go to the hospital as they were worried about Covid.  Patient states she can go to the hospital because she knew she may have come in and she was worried about getting Covid at hospital.  Therefore she went home lay down for a day and the next day she felt better.  Most recently she has had the shortness of breath is progressing and her chest pain seems to be occurring more often.  Today she discussed this with her daughter who asked her to make sure she come to the doctor and be evaluated.    Past Medical History:  Diagnosis Date  . Anxiety   . Complication of anesthesia    "alot of times my BP will be low when I waking up" (01/19/2015)  . Depression   . DVT (deep venous thrombosis) (HCC)   . GERD (gastroesophageal reflux disease)   . Migraine aura, persistent    "a couple times/yr" (01/19/2015)  . Parkinson's disease (HCC)   . Tremor    . Vitamin D deficiency     Past Surgical History:  Procedure Laterality Date  . ANTERIOR CERVICAL DECOMP/DISCECTOMY FUSION  2006   C5-6  . BACK SURGERY    . CARDIAC CATHETERIZATION  ~ 12/2013  . COMBINED HYSTEROSCOPY DIAGNOSTIC / D&C  03/2001  . EP IMPLANTABLE DEVICE N/A 01/20/2015   Procedure: Loop Recorder Insertion;  Surgeon: Marinus MawGregg W Taylor, MD;  [facility];  Service: Cardiovascular;  Laterality: N/A;  . KNEE ARTHROSCOPY Right 1995  . LAPAROSCOPIC CHOLECYSTECTOMY  ~ 2008  . NASAL SEPTUM SURGERY  ~ 1985  . ORIF ANKLE FRACTURE Left 01/21/2015   Procedure: OPEN REDUCTION INTERNAL FIXATION (ORIF) MEDIAL MALLEOLUS FRACTURE;  Surgeon: Myrene GalasMichael Handy, MD;  [facility];  Service: Orthopedics;  Laterality: Left;  . TUBAL LIGATION  1988    Current Medications: Current Meds  Medication Sig  . Biotin 1 MG CAPS Take 1 mg by mouth daily.   . carbidopa-levodopa (SINEMET IR) 25-100 MG tablet TAKE ONE TABLET BY MOUTH 4 TIMES DAILY  . cholecalciferol (VITAMIN D) 1000 UNITS tablet Take 1,000 Units by mouth 2 (two) times daily.   Marland Kitchen. conjugated estrogens (PREMARIN) vaginal cream apply 0.5 GRAMS DAILY for 2 weeks then twice weekly vaginally  . FLUoxetine (PROZAC) 20 MG tablet Take  20 mg by mouth daily.  . fluticasone (FLONASE) 50 MCG/ACT nasal spray Place 1 spray into both nostrils daily.  . furosemide (LASIX) 20 MG tablet TAKE ONE TABLET BY MOUTH AS NEEDED FOR ankle swelling 30  . pantoprazole (PROTONIX) 40 MG tablet Take 40 mg by mouth daily.  Marland Kitchen topiramate (TOPAMAX) 50 MG tablet 1 in the morning, 2 at night (Patient taking differently: Take 50 mg by mouth 2 (two) times daily. 1 in the morning, 2 at night)  . XARELTO 20 MG TABS tablet Take 20 mg by mouth daily.  . [DISCONTINUED] FLUoxetine (PROZAC) 40 MG capsule TAKE ONE CAPSULE BY MOUTH DAILY  . [DISCONTINUED] montelukast (SINGULAIR) 10 MG tablet Take 10 mg by mouth every evening.     Allergies:   Mirapex [pramipexole  dihydrochloride], Other, Pramipexole, Levaquin  [levofloxacin], Penicillins, Prednisone, and Propranolol   Social History   Socioeconomic History  . Marital status: Divorced    Spouse name: Not on file  . Number of children: Not on file  . Years of education: Not on file  . Highest education level: Not on file  Occupational History  . Not on file  Social Needs  . Financial resource strain: Not on file  . Food insecurity    Worry: Not on file    Inability: Not on file  . Transportation needs    Medical: Not on file    Non-medical: Not on file  Tobacco Use  . Smoking status: Never Smoker  . Smokeless tobacco: Never Used  Substance and Sexual Activity  . Alcohol use: No    Alcohol/week: 0.0 standard drinks  . Drug use: No  . Sexual activity: Never  Lifestyle  . Physical activity    Days per week: Not on file    Minutes per session: Not on file  . Stress: Not on file  Relationships  . Social Musician on phone: Not on file    Gets together: Not on file    Attends religious service: Not on file    Active member of club or organization: Not on file    Attends meetings of clubs or organizations: Not on file    Relationship status: Not on file  Other Topics Concern  . Not on file  Social History Narrative  . Not on file     Family History: The patient's family history includes Breast cancer in her paternal aunt and sister; Cancer in her brother, father, and sister; Stroke in her mother.  ROS:   Review of Systems  Constitution: Negative for decreased appetite, fever and weight gain.  HENT: Negative for congestion, ear discharge, hoarse voice and sore throat.   Eyes: Negative for discharge, redness, vision loss in right eye and visual halos.  Cardiovascular: Negative for chest pain, dyspnea on exertion, leg swelling, orthopnea and palpitations.  Respiratory: Negative for cough, hemoptysis, shortness of breath and snoring.   Endocrine: Negative for heat  intolerance and polyphagia.  Hematologic/Lymphatic: Negative for bleeding problem. Does not bruise/bleed easily.  Skin: Negative for flushing, nail changes, rash and suspicious lesions.  Musculoskeletal: Negative for arthritis, joint pain, muscle cramps, myalgias, neck pain and stiffness.  Gastrointestinal: Negative for abdominal pain, bowel incontinence, diarrhea and excessive appetite.  Genitourinary: Negative for decreased libido, genital sores and incomplete emptying.  Neurological: Negative for brief paralysis, focal weakness, headaches and loss of balance.  Psychiatric/Behavioral: Negative for altered mental status, depression and suicidal ideas.  Allergic/Immunologic: Negative for HIV exposure and persistent infections.  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  Similar to EKG performed on March 10, 2018.  Recent Labs: No results found for requested labs within last 8760 hours.  Recent Lipid Panel No results found for: CHOL, TRIG, HDL, CHOLHDL, VLDL, LDLCALC, LDLDIRECT  Physical Exam:    VS:  BP 130/82 (BP Location: Left Arm, Patient Position: Sitting, Cuff Size: Normal)   Pulse 65   Ht 5' 1.5" (1.562 m)   Wt 173 lb (78.5 kg)   SpO2 97%   BMI 32.16 kg/m     Wt Readings from Last 3 Encounters:  02/18/19 173 lb (78.5 kg)  12/29/18 170 lb (77.1 kg)  11/24/18 164 lb (74.4 kg)     GEN: Well nourished, well developed in no acute distress HEENT: Normal NECK: No JVD; No carotid bruits LYMPHATICS: No lymphadenopathy CARDIAC: S1S2 noted,RRR, no murmurs, rubs, gallops RESPIRATORY:  Clear to auscultation without rales, wheezing or rhonchi  ABDOMEN: Soft, non-tender, non-distended, +bowel sounds, no guarding. EXTREMITIES: No edema, No cyanosis, no clubbing MUSCULOSKELETAL:  No edema; No deformity  SKIN: Warm and  dry NEUROLOGIC:  Alert and oriented x 3, non-focal PSYCHIATRIC:  Normal affect, good insight  ASSESSMENT:    1. Dyspnea, unspecified type   2. Chest pain, unspecified type   3. Pre-procedure lab exam   4. Fatigue, unspecified type    PLAN:    1.  I am concerned about her chest pain coupled with her shortness of breath.  Patient does have family history of coronary disease and coupled with her symptoms I am going to pursue ischemic evaluation.  At this time a CT coronary will be appropriate.  She denies any IV contrast dye allergy.  The patient was educated by this testing she as willing to proceed.Sublingual nitroglycerin prescription was sent, its protocol and 911 protocol explained and the patient vocalized understanding questions were answered to the patient's satisfaction.  2.  In addition for shortness of breath and fatigue, a 2D echocardiogram will be ordered to assess for LV/RV function, any structural abnormalities as well as valvular abnormalities.  3.  Blood work will be performed today.  Includes CBC, BMP, mag, TSH.  Fasting lipid profile will be done in the future.   The patient is in agreement with the above plan. The patient left the office in stable condition.  The patient will follow up in 3 months.   Medication Adjustments/Labs and Tests Ordered: Current medicines are reviewed at length with the patient today.  Concerns regarding medicines are outlined above.  Orders Placed This Encounter  Procedures  . CT CORONARY FRACTIONAL FLOW RESERVE DATA PREP  . CT CORONARY FRACTIONAL FLOW RESERVE FLUID ANALYSIS  . CT CORONARY MORPH W/CTA COR W/SCORE W/CA W/CM &/OR WO/CM  . Basic Metabolic Panel (BMET)  . Basic Metabolic Panel (BMET)  . Magnesium  . TSH  . CBC  . EKG 12-Lead  . ECHOCARDIOGRAM COMPLETE   Meds ordered this encounter  Medications  . metoprolol tartrate (LOPRESSOR) 50 MG tablet    Sig: Take 2 tabs ( ) 2 hours prior to CT if Heart rate greater than 55     Dispense:  2 tablet    Refill:  0  . nitroGLYCERIN (NITROSTAT) 0.4 MG SL tablet    Sig: Place 1 tablet (0.4 mg total) under the tongue every 5 (five) minutes as  needed for chest pain.    Dispense:  30 tablet    Refill:  3    Patient Instructions  Medication Instructions:  Your physician has recommended you make the following change in your medication:   Nitroglycerin 0.4 mg sublingual (under your tongue) as needed for chest pain. If experiencing chest pain, stop what you are doing and sit down. Take 1 nitroglycerin and wait 5 minutes. If chest pain continues, take another nitroglycerin and wait 5 minutes. If chest pain does not subside, take 1 more nitroglycerin and dial 911. You make take a total of 3 nitroglycerin in a 15 minute time frame.   *If you need a refill on your cardiac medications before your next appointment, please call your pharmacy*  Lab Work: Your physician recommends that you return for lab work in: TODAY BMP,CBC,TSH,Magnesium,  3-7 days prior to CT : BMP  If you have labs (blood work) drawn today and your tests are completely normal, you will receive your results only by: Marland Kitchen MyChart Message (if you have MyChart) OR . A paper copy in the mail If you have any lab test that is abnormal or we need to change your treatment, we will call you to review the results.  Testing/Procedures: Your physician has requested that you have an echocardiogram. Echocardiography is a painless test that uses sound waves to create images of your heart. It provides your doctor with information about the size and shape of your heart and how well your heart's chambers and valves are working. This procedure takes approximately one hour. There are no restrictions for this procedure.  Your physician has requested that you have cardiac CT. Cardiac computed tomography (CT) is a painless test that uses an x-ray machine to take clear, detailed pictures of your heart. For further information please  visit [email or web address]. Please follow instruction sheet as given.  Your cardiac CT will be scheduled at one of the below locations:   [facility] [address] [place], [place] [address] [phone]  If scheduled at [facility], please arrive at the [facility] main entrance of [facility] 30-45 minutes prior to test start time. Proceed to the [facility] Radiology Department (first floor) to check-in and test prep.  Please follow these instructions carefully (unless otherwise directed):   On the Night Before the Test: . Be sure to Drink plenty of water. . Do not consume any caffeinated/decaffeinated beverages or chocolate 12 hours prior to your test. . Do not take any antihistamines 12 hours prior to your test.  On the Day of the Test: . Drink plenty of water. Do not drink any water within one hour of the test. . Do not eat any food 4 hours prior to the test. . You may take your regular medications prior to the test.  . Take metoprolol (Lopressor) two hours prior to test. . HOLD Furosemide/Hydrochlorothiazide morning of the test. . FEMALES- please wear underwire-free bra if available                 -If HR is less than 55 BPM- No Beta Blocker                -IF HR is greater than 55 BPM and patient is less than or equal to 45 yrs old Lopressor 100mg  x1.   After the Test: . Drink plenty of water. . After receiving IV contrast, you may experience a mild flushed feeling. This is normal. . On occasion, you  may experience a mild rash up to 24 hours after the test. This is not dangerous. If this occurs, you can take Benadryl 25 mg and increase your fluid intake. . If you experience trouble breathing, this can be serious. If it is severe call 911 IMMEDIATELY. If it is mild, please call our office.   Once we have confirmed authorization from your insurance company, we will call you to set up a date and time for your test.   For non-scheduling related  questions, please contact the cardiac imaging nurse navigator should you have any questions/concerns: Rockwell Alexandria, RN Navigator Cardiac Imaging [facility] [phone] Office    Follow-Up: At [facility], you and your health needs are our priority.  As part of our continuing mission to provide you with exceptional heart care, we have created designated Provider Care Teams.  These Care Teams include your primary Cardiologist (physician) and Advanced Practice Providers (APPs -  Physician Assistants and Nurse Practitioners) who all work together to provide you with the care you need, when you need it.  Your next appointment:   3 months  The format for your next appointment:   In Person  Provider:   Thomasene Ripple, DO  Other Instructions   Echocardiogram An echocardiogram is a procedure that uses painless sound waves (ultrasound) to produce an image of the heart. Images from an echocardiogram can provide important information about:  Signs of coronary artery disease (CAD).  Aneurysm detection. An aneurysm is a weak or damaged part of an artery wall that bulges out from the normal force of blood pumping through the body.  Heart size and shape. Changes in the size or shape of the heart can be associated with certain conditions, including heart failure, aneurysm, and CAD.  Heart muscle function.  Heart valve function.  Signs of a past heart attack.  Fluid buildup around the heart.  Thickening of the heart muscle.  A tumor or infectious growth around the heart valves. Tell a health care provider about:  Any allergies you have.  All medicines you are taking, including vitamins, herbs, eye drops, creams, and over-the-counter medicines.  Any blood disorders you have.  Any surgeries you have had.  Any medical conditions you have.  Whether you are pregnant or may be pregnant. What are the risks? Generally, this is a safe procedure. However,  problems may occur, including:  Allergic reaction to dye (contrast) that may be used during the procedure. What happens before the procedure? No specific preparation is needed. You may eat and drink normally. What happens during the procedure?   An IV tube may be inserted into one of your veins.  You may receive contrast through this tube. A contrast is an injection that improves the quality of the pictures from your heart.  A gel will be applied to your chest.  A wand-like tool (transducer) will be moved over your chest. The gel will help to transmit the sound waves from the transducer.  The sound waves will harmlessly bounce off of your heart to allow the heart images to be captured in real-time motion. The images will be recorded on a computer. The procedure may vary among health care providers and hospitals. What happens after the procedure?  You may return to your normal, everyday life, including diet, activities, and medicines, unless your health care provider tells you not to do that. Summary  An echocardiogram is a procedure that uses painless sound waves (ultrasound) to produce an  image of the heart.  Images from an echocardiogram can provide important information about the size and shape of your heart, heart muscle function, heart valve function, and fluid buildup around your heart.  You do not need to do anything to prepare before this procedure. You may eat and drink normally.  After the echocardiogram is completed, you may return to your normal, everyday life, unless your health care provider tells you not to do that. This information is not intended to replace advice given to you by your health care provider. Make sure you discuss any questions you have with your health care provider. Document Released: 04/12/2000 Document Revised: 08/06/2018 Document Reviewed: 05/18/2016 Elsevier Patient Education  2020 Elsevier Inc.  Cardiac CT Angiogram  A cardiac CT angiogram is  a procedure to look at the heart and the area around the heart. It may be done to help find the cause of chest pains or other symptoms of heart disease. During this procedure, a large X-ray machine, called a CT scanner, takes detailed pictures of the heart and the surrounding area after a dye (contrast material) has been injected into blood vessels in the area. The procedure is also sometimes called a coronary CT angiogram, coronary artery scanning, or CTA. A cardiac CT angiogram allows the health care provider to see how well blood is flowing to and from the heart. The health care provider will be able to see if there are any problems, such as:  Blockage or narrowing of the coronary arteries in the heart.  Fluid around the heart.  Signs of weakness or disease in the muscles, valves, and tissues of the heart. Tell a health care provider about:  Any allergies you have. This is especially important if you have had a previous allergic reaction to contrast dye.  All medicines you are taking, including vitamins, herbs, eye drops, creams, and over-the-counter medicines.  Any blood disorders you have.  Any surgeries you have had.  Any medical conditions you have.  Whether you are pregnant or may be pregnant.  Any anxiety disorders, chronic pain, or other conditions you have that may increase your stress or prevent you from lying still. What are the risks? Generally, this is a safe procedure. However, problems may occur, including:  Bleeding.  Infection.  Allergic reactions to medicines or dyes.  Damage to other structures or organs.  Kidney damage from the dye or contrast that is used.  Increased risk of cancer from radiation exposure. This risk is low. Talk with your health care provider about: ? The risks and benefits of testing. ? How you can receive the lowest dose of radiation. What happens before the procedure?  Wear comfortable clothing and remove any jewelry, glasses,  dentures, and hearing aids.  Follow instructions from your health care provider about eating and drinking. This may include: ? For 12 hours before the test - avoid caffeine. This includes tea, coffee, soda, energy drinks, and diet pills. Drink plenty of water or other fluids that do not have caffeine in them. Being well-hydrated can prevent complications. ? For 4-6 hours before the test - stop eating and drinking. The contrast dye can cause nausea, but this is less likely if your stomach is empty.  Ask your health care provider about changing or stopping your regular medicines. This is especially important if you are taking diabetes medicines, blood thinners, or medicines to treat erectile dysfunction. What happens during the procedure?  Hair on your chest may need to be removed so  that small sticky patches called electrodes can be placed on your chest. These will transmit information that helps to monitor your heart during the test.  An IV tube will be inserted into one of your veins.  You might be given a medicine to control your heart rate during the test. This will help to ensure that good images are obtained.  You will be asked to lie on an exam table. This table will slide in and out of the CT machine during the procedure.  Contrast dye will be injected into the IV tube. You might feel warm, or you may get a metallic taste in your mouth.  You will be given a medicine (nitroglycerin) to relax (dilate) the arteries in your heart.  The table that you are lying on will move into the CT machine tunnel for the scan.  The person running the machine will give you instructions while the scans are being done. You may be asked to: ? Keep your arms above your head. ? Hold your breath. ? Stay very still, even if the table is moving.  When the scanning is complete, you will be moved out of the machine.  The IV tube will be removed. The procedure may vary among health care providers and  hospitals. What happens after the procedure?  You might feel warm, or you may get a metallic taste in your mouth from the contrast dye.  You may have a headache from the nitroglycerin.  After the procedure, drink water or other fluids to wash (flush) the contrast material out of your body.  Contact a health care provider if you have any symptoms of allergy to the contrast. These symptoms include: ? Shortness of breath. ? Rash or hives. ? A racing heartbeat.  Most people can return to their normal activities right after the procedure. Ask your health care provider what activities are safe for you.  It is up to you to get the results of your procedure. Ask your health care provider, or the department that is doing the procedure, when your results will be ready. Summary  A cardiac CT angiogram is a procedure to look at the heart and the area around the heart. It may be done to help find the cause of chest pains or other symptoms of heart disease.  During this procedure, a large X-ray machine, called a CT scanner, takes detailed pictures of the heart and the surrounding area after a dye (contrast material) has been injected into blood vessels in the area.  Ask your health care provider about changing or stopping your regular medicines before the procedure. This is especially important if you are taking diabetes medicines, blood thinners, or medicines to treat erectile dysfunction.  After the procedure, drink water or other fluids to wash (flush) the contrast material out of your body. This information is not intended to replace advice given to you by your health care provider. Make sure you discuss any questions you have with your health care provider. Document Released: 03/28/2008 Document Revised: 03/28/2017 Document Reviewed: 03/04/2016 Elsevier Patient Education  2020 [employer].      Adopting a Healthy Lifestyle.  Know what a healthy weight is for you (roughly BMI <25) and aim  to maintain this   Aim for 7+ servings of fruits and vegetables daily   65-80+ fluid ounces of water or unsweet tea for healthy kidneys   Limit to max 1 drink of alcohol per day; avoid smoking/tobacco   Limit animal fats in  diet for cholesterol and heart health - choose grass fed whenever available   Avoid highly processed foods, and foods high in saturated/trans fats   Aim for low stress - take time to unwind and care for your mental health   Aim for 150 min of moderate intensity exercise weekly for heart health, and weights twice weekly for bone health   Aim for 7-9 hours of sleep daily   When it comes to diets, agreement about the perfect plan isnt easy to find, even among the experts. Experts at the [facility] [employer] developed an idea known as the Healthy Eating Plate. Just imagine a plate divided into logical, healthy portions.   The emphasis is on diet quality:   Load up on vegetables and fruits - one-half of your plate: Aim for color and variety, and remember that potatoes dont count.   Go for whole grains - one-quarter of your plate: Whole wheat, barley, wheat berries, quinoa, oats, brown rice, and foods made with them. If you want pasta, go with whole wheat pasta.   Protein power - one-quarter of your plate: Fish, chicken, beans, and nuts are all healthy, versatile protein sources. Limit red meat.   The diet, however, does go beyond the plate, offering a few other suggestions.   Use healthy plant oils, such as olive, canola, soy, corn, sunflower and peanut. Check the labels, and avoid partially hydrogenated oil, which have unhealthy trans fats.   If youre thirsty, drink water. Coffee and tea are good in moderation, but skip sugary drinks and limit milk and dairy products to one or two daily servings.   The type of carbohydrate in the diet is more important than the amount. Some sources of carbohydrates, such as vegetables, fruits, whole grains, and  beans-are healthier than others.   Finally, stay active  Signed, Thomasene Ripple, DO  02/18/2019 3:40 PM    [facility]

## 2019-02-19 LAB — CBC
Hematocrit: 40 % (ref 34.0–46.6)
Hemoglobin: 13 g/dL (ref 11.1–15.9)
MCH: 27.1 pg (ref 26.6–33.0)
MCHC: 32.5 g/dL (ref 31.5–35.7)
MCV: 84 fL (ref 79–97)
Platelets: 246 10*3/uL (ref 150–450)
RBC: 4.79 x10E6/uL (ref 3.77–5.28)
RDW: 13.6 % (ref 11.7–15.4)
WBC: 8.1 10*3/uL (ref 3.4–10.8)

## 2019-02-19 LAB — BASIC METABOLIC PANEL
BUN/Creatinine Ratio: 26 (ref 12–28)
BUN: 22 mg/dL (ref 8–27)
CO2: 22 mmol/L (ref 20–29)
Calcium: 9.6 mg/dL (ref 8.7–10.3)
Chloride: 109 mmol/L — ABNORMAL HIGH (ref 96–106)
Creatinine, Ser: 0.85 mg/dL (ref 0.57–1.00)
GFR calc Af Amer: 82 mL/min/{1.73_m2} (ref >59)
GFR calc non Af Amer: 71 mL/min/{1.73_m2} (ref >59)
Glucose: 91 mg/dL (ref 65–99)
Potassium: 4.4 mmol/L (ref 3.5–5.2)
Sodium: 145 mmol/L — ABNORMAL HIGH (ref 134–144)

## 2019-02-19 LAB — MAGNESIUM: Magnesium: 2.2 mg/dL (ref 1.6–2.3)

## 2019-02-19 LAB — TSH: TSH: 1.22 u[IU]/mL (ref 0.450–4.500)

## 2019-02-22 ENCOUNTER — Telehealth: Payer: Self-pay | Admitting: *Deleted

## 2019-02-22 NOTE — Telephone Encounter (Signed)
Telephone call to patient. Left message that lab results were normal except sodium has slightly high and to drink water. Pt to call with any questions.

## 2019-02-22 NOTE — Telephone Encounter (Signed)
-----   Message from Berniece Salines, DO sent at 02/20/2019  9:20 PM EDT ----- Please notify patient that her sodium is slightly elevated. Please encourage her to drink water.

## 2019-02-23 ENCOUNTER — Telehealth: Payer: Self-pay | Admitting: Cardiology

## 2019-02-23 NOTE — Telephone Encounter (Signed)
Please call patietnt about her having an episode last night.. She felt weird but dod not take nitro.Barbara Miranda

## 2019-02-23 NOTE — Telephone Encounter (Signed)
Telephone call to patient. States was having chest pain when she woke up this morning but has since been relieved on own. Currently no chest pain. Informed pt she needs to take the nitroglycerin prescribed and if unrelieved after 3 to go to the ED. Pt verbalized understanding.

## 2019-02-26 ENCOUNTER — Ambulatory Visit: Payer: Medicare Other

## 2019-03-08 DIAGNOSIS — Z7901 Long term (current) use of anticoagulants: Secondary | ICD-10-CM

## 2019-03-08 DIAGNOSIS — R1310 Dysphagia, unspecified: Secondary | ICD-10-CM

## 2019-03-08 DIAGNOSIS — I82432 Acute embolism and thrombosis of left popliteal vein: Secondary | ICD-10-CM

## 2019-03-22 ENCOUNTER — Other Ambulatory Visit: Payer: Self-pay

## 2019-03-22 ENCOUNTER — Ambulatory Visit (INDEPENDENT_AMBULATORY_CARE_PROVIDER_SITE_OTHER): Payer: Medicare Other

## 2019-03-22 DIAGNOSIS — R0600 Dyspnea, unspecified: Secondary | ICD-10-CM

## 2019-03-22 NOTE — Progress Notes (Signed)
Complete echocardiogram has been performed.  Jimmy Ailani Governale RDCS, RVT 

## 2019-03-29 ENCOUNTER — Telehealth: Payer: Self-pay | Admitting: Cardiology

## 2019-03-29 NOTE — Telephone Encounter (Signed)
Patient called with questions about CT.Marland Kitchen

## 2019-03-30 NOTE — Telephone Encounter (Signed)
Returned patient's call and answered questions regarding pre-procedure lab work. Advised patient to return to our [place] office for lab work at least 3-7 days before her cardiac CTA on 04/12/2019 as the order has already been placed. Patient informed no appointment is needed and she does not have to fast beforehand. Patient is agreeable and verbalized understanding. No further questions.

## 2019-04-09 ENCOUNTER — Telehealth (HOSPITAL_COMMUNITY): Payer: Self-pay | Admitting: Emergency Medicine

## 2019-04-09 LAB — BASIC METABOLIC PANEL
BUN/Creatinine Ratio: 20 (ref 12–28)
BUN: 17 mg/dL (ref 8–27)
CO2: 19 mmol/L — ABNORMAL LOW (ref 20–29)
Calcium: 9.1 mg/dL (ref 8.7–10.3)
Chloride: 108 mmol/L — ABNORMAL HIGH (ref 96–106)
Creatinine, Ser: 0.86 mg/dL (ref 0.57–1.00)
GFR calc Af Amer: 81 mL/min/{1.73_m2} (ref >59)
GFR calc non Af Amer: 70 mL/min/{1.73_m2} (ref >59)
Glucose: 86 mg/dL (ref 65–99)
Potassium: 3.7 mmol/L (ref 3.5–5.2)
Sodium: 139 mmol/L (ref 134–144)

## 2019-04-09 NOTE — Telephone Encounter (Signed)
Reaching out to patient to offer assistance regarding upcoming cardiac imaging study; pt verbalizes understanding of appt date/time, parking situation and where to check in, pre-test NPO status and medications ordered, and verified current allergies; name and call back number provided for further questions should they arise Danial Hlavac RN Navigator Cardiac Imaging [facility] [phone] office [phone] cell 

## 2019-04-12 ENCOUNTER — Ambulatory Visit (HOSPITAL_COMMUNITY)
Admission: RE | Admit: 2019-04-12 | Discharge: 2019-04-12 | Disposition: A | Discharge status: Home / Self Care | Payer: Medicare Other | Source: Ambulatory Visit | Attending: Cardiology | Admitting: Cardiology

## 2019-04-12 ENCOUNTER — Ambulatory Visit
Admission: RE | Admit: 2019-04-12 | Discharge: 2019-04-12 | Disposition: A | Discharge status: Home / Self Care | Payer: Medicare Other | Source: Ambulatory Visit | Attending: Obstetrics and Gynecology | Admitting: Obstetrics and Gynecology

## 2019-04-12 ENCOUNTER — Other Ambulatory Visit: Payer: Self-pay

## 2019-04-12 DIAGNOSIS — I700 Atherosclerosis of aorta: Secondary | ICD-10-CM

## 2019-04-12 DIAGNOSIS — Z1231 Encounter for screening mammogram for malignant neoplasm of breast: Secondary | ICD-10-CM

## 2019-04-12 DIAGNOSIS — N390 Urinary tract infection, site not specified: Secondary | ICD-10-CM | POA: Insufficient documentation

## 2019-04-12 DIAGNOSIS — R079 Chest pain, unspecified: Secondary | ICD-10-CM | POA: Diagnosis not present

## 2019-04-12 DIAGNOSIS — K449 Diaphragmatic hernia without obstruction or gangrene: Secondary | ICD-10-CM | POA: Diagnosis not present

## 2019-04-12 MED ORDER — IOHEXOL 350 MG/ML SOLN
100.0000 mL | Freq: Once | INTRAVENOUS | Status: AC | PRN
  Administered 2019-04-12: 100 mL via INTRAVENOUS

## 2019-04-12 MED ORDER — NITROGLYCERIN 0.4 MG SL SUBL
SUBLINGUAL_TABLET | SUBLINGUAL | Status: AC
  Filled 2019-04-12: qty 2

## 2019-04-12 MED ORDER — NITROGLYCERIN 0.4 MG SL SUBL
0.8000 mg | SUBLINGUAL_TABLET | Freq: Once | SUBLINGUAL | Status: AC
  Administered 2019-04-12: 0.8 mg via SUBLINGUAL

## 2019-04-13 ENCOUNTER — Telehealth: Payer: Self-pay | Admitting: Cardiology

## 2019-04-13 NOTE — Telephone Encounter (Signed)
Please call patient regarding test results

## 2019-04-13 NOTE — Telephone Encounter (Signed)
Telephone call to patient. Informed of Cardiac cTa results. No further questions.

## 2019-04-14 ENCOUNTER — Encounter: Payer: Self-pay | Admitting: *Deleted

## 2019-05-13 ENCOUNTER — Ambulatory Visit (INDEPENDENT_AMBULATORY_CARE_PROVIDER_SITE_OTHER): Payer: Medicare PPO | Admitting: Cardiology

## 2019-05-13 ENCOUNTER — Other Ambulatory Visit: Payer: Self-pay | Admitting: Neurology

## 2019-05-13 ENCOUNTER — Other Ambulatory Visit: Payer: Self-pay

## 2019-05-13 ENCOUNTER — Encounter: Payer: Self-pay | Admitting: Cardiology

## 2019-05-13 VITALS — BP 110/82 | HR 69 | Ht 61.5 in | Wt 177.0 lb

## 2019-05-13 DIAGNOSIS — I5189 Other ill-defined heart diseases: Secondary | ICD-10-CM

## 2019-05-13 DIAGNOSIS — R079 Chest pain, unspecified: Secondary | ICD-10-CM

## 2019-05-13 DIAGNOSIS — I77810 Thoracic aortic ectasia: Secondary | ICD-10-CM

## 2019-05-13 NOTE — Patient Instructions (Signed)
Medication Instructions:  Your physician recommends that you continue on your current medications as directed. Please refer to the Current Medication list given to you today.  *If you need a refill on your cardiac medications before your next appointment, please call your pharmacy*  Lab Work: NOne If you have labs (blood work) drawn today and your tests are completely normal, you will receive your results only by: Marland Kitchen MyChart Message (if you have MyChart) OR . A paper copy in the mail If you have any lab test that is abnormal or we need to change your treatment, we will call you to review the results.  Testing/Procedures: None  Follow-Up: At [facility], you and your health needs are our priority.  As part of our continuing mission to provide you with exceptional heart care, we have created designated Provider Care Teams.  These Care Teams include your primary Cardiologist (physician) and Advanced Practice Providers (APPs -  Physician Assistants and Nurse Practitioners) who all work together to provide you with the care you need, when you need it.  Your next appointment:   12 month(s)  The format for your next appointment:   In Person  Provider:   Thomasene Ripple, DO  Other Instructions

## 2019-05-13 NOTE — Progress Notes (Signed)
Cardiology Office Note:    Date:  05/13/2019   ID:  Barbara Vernell BarrierSue Miranda, DOB [birth year], MRN [id number]  PCP:  Philemon KingdomProchnau, [name], MD  Cardiologist:  Thomasene RippleKardie Normal Recinos, DO  Electrophysiologist:  None   Referring MD: Philemon KingdomProchnau, [name], MD   Chief Complaint  Patient presents with  . Follow-up    History of Present Illness:    Barbara Miranda is a 69 y.o. female with a hx of DVT with most recent recurrent clot next week ago (on Xarelto), history of syncope and is status post ILR implantation and removal and  per records she did not have any bradycardia or tachyarrhythmias.  I saw the patient on 06/25/2018 at that time she reported that she was experiencing shortness of breath and chest pain.  Echocardiogram was ordered as well as given concern for chest pain and coronary CTA was also ordered.  In the interim the patient was able to get this testing done and she is here for follow-up visit to discuss result.   Today he denies any symptoms of chest pain or shortness of breath.  Past Medical History:  Diagnosis Date  . Anxiety   . Complication of anesthesia    "alot of times my BP will be low when I waking up" (01/19/2015)  . Depression   . DVT (deep venous thrombosis) (HCC)   . GERD (gastroesophageal reflux disease)   . Migraine aura, persistent    "a couple times/yr" (01/19/2015)  . Parkinson's disease (HCC)   . Tremor   . Vitamin D deficiency     Past Surgical History:  Procedure Laterality Date  . ANTERIOR CERVICAL DECOMP/DISCECTOMY FUSION  2006   C5-6  . BACK SURGERY    . CARDIAC CATHETERIZATION  ~ 12/2013  . COMBINED HYSTEROSCOPY DIAGNOSTIC / D&C  03/2001  . EP IMPLANTABLE DEVICE N/A 01/20/2015   Procedure: Loop Recorder Insertion;  Surgeon: Marinus MawGregg W Taylor, MD;  [facility];  Service: Cardiovascular;  Laterality: N/A;  . KNEE ARTHROSCOPY Right 1995  . LAPAROSCOPIC CHOLECYSTECTOMY  ~ 2008  . NASAL SEPTUM SURGERY  ~ 1985  . ORIF ANKLE FRACTURE Left 01/21/2015   Procedure: OPEN REDUCTION INTERNAL FIXATION (ORIF) MEDIAL MALLEOLUS FRACTURE;  Surgeon: Myrene GalasMichael Handy, MD;  [facility];  Service: Orthopedics;  Laterality: Left;  . TUBAL LIGATION  1988    Current Medications: Current Meds  Medication Sig  . Biotin 1 MG CAPS Take 1 mg by mouth daily.   . carbidopa-levodopa (SINEMET IR) 25-100 MG tablet TAKE ONE TABLET BY MOUTH 4 TIMES DAILY  . cholecalciferol (VITAMIN D) 1000 UNITS tablet Take 1,000 Units by mouth 2 (two) times daily.   Marland Kitchen. conjugated estrogens (PREMARIN) vaginal cream apply 0.5 GRAMS DAILY for 2 weeks then twice weekly vaginally  . FLUoxetine (PROZAC) 20 MG tablet Take 20 mg by mouth daily.  . fluticasone (FLONASE) 50 MCG/ACT nasal spray Place 1 spray into both nostrils daily.  . furosemide (LASIX) 20 MG tablet TAKE ONE TABLET BY MOUTH AS NEEDED FOR ankle swelling 30  . metoprolol tartrate (LOPRESSOR) 50 MG tablet Take 2 tabs (100mg ) 2 hours prior to CT if Heart rate greater than 55  . nitroGLYCERIN (NITROSTAT) 0.4 MG SL tablet Place 1 tablet (0.4 mg total) under the tongue every 5 (five) minutes as needed for chest pain.  . pantoprazole (PROTONIX) 40 MG tablet Take 40 mg by mouth daily.  Marland Kitchen. topiramate (TOPAMAX) 50 MG tablet 1 in the morning, 2 at night (Patient taking differently:  Take 50 mg by mouth 2 (two) times daily. 1 in the morning, 2 at night)  . XARELTO 20 MG TABS tablet Take 20 mg by mouth daily.     Allergies:   Mirapex [pramipexole dihydrochloride], Other, Pramipexole, Levaquin  [levofloxacin], Penicillins, Prednisone, and Propranolol   Social History   Socioeconomic History  . Marital status: Divorced    Spouse name: Not on file  . Number of children: Not on file  . Years of education: Not on file  . Highest education level: Not on file  Occupational History  . Not on file  Tobacco Use  . Smoking status: Never Smoker  . Smokeless tobacco: Never Used  Substance and Sexual Activity  . Alcohol use: No     Alcohol/week: 0.0 standard drinks  . Drug use: No  . Sexual activity: Never  Other Topics Concern  . Not on file  Social History Narrative  . Not on file   Social Determinants of Health   Financial Resource Strain:   . Difficulty of Paying Living Expenses: Not on file  Food Insecurity:   . Worried About Programme researcher, broadcasting/film/video in the Last Year: Not on file  . Ran Out of Food in the Last Year: Not on file  Transportation Needs:   . Lack of Transportation (Medical): Not on file  . Lack of Transportation (Non-Medical): Not on file  Physical Activity:   . Days of Exercise per Week: Not on file  . Minutes of Exercise per Session: Not on file  Stress:   . Feeling of Stress : Not on file  Social Connections:   . Frequency of Communication with Friends and Family: Not on file  . Frequency of Social Gatherings with Friends and Family: Not on file  . Attends Religious Services: Not on file  . Active Member of Clubs or Organizations: Not on file  . Attends Banker Meetings: Not on file  . Marital Status: Not on file     Family History: The patient's family history includes Breast cancer in her paternal aunt and sister; Cancer in her brother, father, and sister; Stroke in her mother.  ROS:   Review of Systems  Constitution: Negative for decreased appetite, fever and weight gain.  HENT: Negative for congestion, ear discharge, hoarse voice and sore throat.   Eyes: Negative for discharge, redness, vision loss in right eye and visual halos.  Cardiovascular: Negative for chest pain, dyspnea on exertion, leg swelling, orthopnea and palpitations.  Respiratory: Negative for cough, hemoptysis, shortness of breath and snoring.   Endocrine: Negative for heat intolerance and polyphagia.  Hematologic/Lymphatic: Negative for bleeding problem. Does not bruise/bleed easily.  Skin: Negative for flushing, nail changes, rash and suspicious lesions.  Musculoskeletal: Negative for arthritis,  joint pain, muscle cramps, myalgias, neck pain and stiffness.  Gastrointestinal: Negative for abdominal pain, bowel incontinence, diarrhea and excessive appetite.  Genitourinary: Negative for decreased libido, genital sores and incomplete emptying.  Neurological: Negative for brief paralysis, focal weakness, headaches and loss of balance.  Psychiatric/Behavioral: Negative for altered mental status, depression and suicidal ideas.  Allergic/Immunologic: Negative for HIV exposure and persistent infections.    EKGs/Labs/Other Studies Reviewed:    The following studies were reviewed today:   EKG:  None today  TTE FINDINGS 03/21/2018  Left Ventricle: Left ventricular ejection fraction, by visual estimation, is 60 to 65%. The left ventricle has normal function. There is no left ventricular hypertrophy. Left ventricular diastolic parameters are consistent with Grade I diastolic  dysfunction (impaired relaxation). Normal left atrial pressure.  Right Ventricle: The right ventricular size is normal. No increase in right ventricular wall thickness. Global RV systolic function is has normal systolic function.  Left Atrium: Left atrial size was normal in size.  Right Atrium: Right atrial size was normal in size  Pericardium: There is no evidence of pericardial effusion.  Mitral Valve: The mitral valve is normal in structure. No evidence of mitral valve stenosis by observation. No evidence of mitral valve regurgitation.  Tricuspid Valve: The tricuspid valve is normal in structure. Tricuspid valve regurgitation is trivial.  Aortic Valve: The aortic valve is normal in structure. Aortic valve regurgitation is not visualized. The aortic valve is structurally normal, with no evidence of sclerosis or stenosis.  Pulmonic Valve: The pulmonic valve was normal in structure. Pulmonic valve regurgitation is not visualized.  Aorta: The aortic root, ascending aorta and aortic arch are all structurally  normal, with no evidence of dilitation or obstruction.  Venous: The inferior vena cava is normal in size with greater than 50% respiratory variability, suggesting right atrial pressure of 3 mmHg.  IAS/Shunts: No atrial level shunt detected by color flow Doppler. No ventricular septal defect is seen or detected. There is no evidence of an atrial septal defect.  CT coronary FINDINGS: Non-cardiac: See separate report from [facility]. No significant findings on limited lung and soft tissue windows.  Calcium Score: One small isolated area of calcium in proximal LAD  Coronary Arteries: Right dominant with no anomalies  LM: Normal  LAD: 1-24% calcified plaque in proximal vessel  D1: Normal  D2: Normal  Circumflex: Normal  OM1: Normal  AV groove : Normal  RCA: Normal  PDA: Normal  PLA: Normal  IMPRESSION: 1. Calcium score 1 which is 50 th percentile for age and sex  2.  Mild aortic root dilatation 3.8 cm  3.  CAD RADS 1 non obstructive CAD see description above  Ct chest  IMPRESSION: 1. Small hiatal hernia. 2.  Aortic Atherosclerosis (ICD10-I70.0). 3. Otherwise no significant extracardiac findings.   Recent Labs: 02/18/2019: Hemoglobin 13.0; Magnesium 2.2; Platelets 246; TSH 1.220 04/09/2019: BUN 17; Creatinine, Ser 0.86; Potassium 3.7; Sodium 139  Recent Lipid Panel No results found for: CHOL, TRIG, HDL, CHOLHDL, VLDL, LDLCALC, LDLDIRECT  Physical Exam:    VS:  BP 110/82 (BP Location: Right Arm, Patient Position: Sitting, Cuff Size: Normal)   Pulse 69   Ht 5' 1.5" (1.562 m)   Wt 177 lb (80.3 kg)   SpO2 97%   BMI 32.90 kg/m     Wt Readings from Last 3 Encounters:  05/13/19 177 lb (80.3 kg)  02/18/19 173 lb (78.5 kg)  12/29/18 170 lb (77.1 kg)     GEN: Well nourished, well developed in no acute distress HEENT: Normal NECK: No JVD; No carotid bruits LYMPHATICS: No lymphadenopathy CARDIAC: S1S2 noted,RRR, no murmurs, rubs,  gallops RESPIRATORY:  Clear to auscultation without rales, wheezing or rhonchi  ABDOMEN: Soft, non-tender, non-distended, +bowel sounds, no guarding. EXTREMITIES: No edema, No cyanosis, no clubbing MUSCULOSKELETAL:  No edema; No deformity  SKIN: Warm and dry NEUROLOGIC:  Alert and oriented x 3, non-focal PSYCHIATRIC:  Normal affect, good insight  ASSESSMENT:    1. Chest pain of uncertain etiology   2. Diastolic dysfunction   3. Ascending aorta dilatation (HCC)    PLAN:    Coronary CT shows no evidence of coronary disease but mild dilated ascending aorta 38 mm and hiatal hernia which I discussed with  the patient.  Her echo showed evidence of grade 1 diastolic dysfunction.  Her shortness of breath is improved.  She tells me that she has gained some weight lately which she suspect is a fluid wave.  She is on as needed Lasix at home have asked the patient to take this medication especially if she gains more than 5 pounds in 1 week.   The patient is in agreement with the above plan. The patient left the office in stable condition.  The patient will follow up in 12 months or sooner if needed.  Medication Adjustments/Labs and Tests Ordered: Current medicines are reviewed at length with the patient today.  Concerns regarding medicines are outlined above.  No orders of the defined types were placed in this encounter.  No orders of the defined types were placed in this encounter.   Patient Instructions  Medication Instructions:  Your physician recommends that you continue on your current medications as directed. Please refer to the Current Medication list given to you today.  *If you need a refill on your cardiac medications before your next appointment, please call your pharmacy*  Lab Work: NOne If you have labs (blood work) drawn today and your tests are completely normal, you will receive your results only by: Marland Kitchen MyChart Message (if you have MyChart) OR . A paper copy in the mail If  you have any lab test that is abnormal or we need to change your treatment, we will call you to review the results.  Testing/Procedures: None  Follow-Up: At [facility], you and your health needs are our priority.  As part of our continuing mission to provide you with exceptional heart care, we have created designated Provider Care Teams.  These Care Teams include your primary Cardiologist (physician) and Advanced Practice Providers (APPs -  Physician Assistants and Nurse Practitioners) who all work together to provide you with the care you need, when you need it.  Your next appointment:   12 month(s)  The format for your next appointment:   In Person  Provider:   Thomasene Ripple, DO  Other Instructions      Adopting a Healthy Lifestyle.  Know what a healthy weight is for you (roughly BMI <25) and aim to maintain this   Aim for 7+ servings of fruits and vegetables daily   65-80+ fluid ounces of water or unsweet tea for healthy kidneys   Limit to max 1 drink of alcohol per day; avoid smoking/tobacco   Limit animal fats in diet for cholesterol and heart health - choose grass fed whenever available   Avoid highly processed foods, and foods high in saturated/trans fats   Aim for low stress - take time to unwind and care for your mental health   Aim for 150 min of moderate intensity exercise weekly for heart health, and weights twice weekly for bone health   Aim for 7-9 hours of sleep daily   When it comes to diets, agreement about the perfect plan isnt easy to find, even among the experts. Experts at the [facility] of [employer] developed an idea known as the Healthy Eating Plate. Just imagine a plate divided into logical, healthy portions.   The emphasis is on diet quality:   Load up on vegetables and fruits - one-half of your plate: Aim for color and variety, and remember that potatoes dont count.   Go for whole grains - one-quarter of your plate: Whole wheat,  barley, wheat berries, quinoa, oats, brown rice,  and foods made with them. If you want pasta, go with whole wheat pasta.   Protein power - one-quarter of your plate: Fish, chicken, beans, and nuts are all healthy, versatile protein sources. Limit red meat.   The diet, however, does go beyond the plate, offering a few other suggestions.   Use healthy plant oils, such as olive, canola, soy, corn, sunflower and peanut. Check the labels, and avoid partially hydrogenated oil, which have unhealthy trans fats.   If youre thirsty, drink water. Coffee and tea are good in moderation, but skip sugary drinks and limit milk and dairy products to one or two daily servings.   The type of carbohydrate in the diet is more important than the amount. Some sources of carbohydrates, such as vegetables, fruits, whole grains, and beans-are healthier than others.   Finally, stay active  Signed, Thomasene Ripple, DO  05/13/2019 1:53 PM    [facility]

## 2019-05-25 DIAGNOSIS — J0140 Acute pansinusitis, unspecified: Secondary | ICD-10-CM | POA: Diagnosis not present

## 2019-05-25 DIAGNOSIS — R519 Headache, unspecified: Secondary | ICD-10-CM | POA: Diagnosis not present

## 2019-05-25 DIAGNOSIS — R0981 Nasal congestion: Secondary | ICD-10-CM | POA: Diagnosis not present

## 2019-06-16 DIAGNOSIS — Z1331 Encounter for screening for depression: Secondary | ICD-10-CM | POA: Diagnosis not present

## 2019-06-16 DIAGNOSIS — M5030 Other cervical disc degeneration, unspecified cervical region: Secondary | ICD-10-CM | POA: Diagnosis not present

## 2019-06-16 DIAGNOSIS — M47812 Spondylosis without myelopathy or radiculopathy, cervical region: Secondary | ICD-10-CM | POA: Diagnosis not present

## 2019-06-16 DIAGNOSIS — F419 Anxiety disorder, unspecified: Secondary | ICD-10-CM | POA: Diagnosis not present

## 2019-06-16 DIAGNOSIS — Z6833 Body mass index (BMI) 33.0-33.9, adult: Secondary | ICD-10-CM | POA: Diagnosis not present

## 2019-06-16 DIAGNOSIS — G20 Parkinson's disease: Secondary | ICD-10-CM | POA: Diagnosis not present

## 2019-07-02 DIAGNOSIS — Z6833 Body mass index (BMI) 33.0-33.9, adult: Secondary | ICD-10-CM | POA: Diagnosis not present

## 2019-07-02 DIAGNOSIS — D6862 Lupus anticoagulant syndrome: Secondary | ICD-10-CM | POA: Diagnosis not present

## 2019-07-02 DIAGNOSIS — M47812 Spondylosis without myelopathy or radiculopathy, cervical region: Secondary | ICD-10-CM | POA: Diagnosis not present

## 2019-07-02 DIAGNOSIS — G20 Parkinson's disease: Secondary | ICD-10-CM | POA: Diagnosis not present

## 2019-07-02 DIAGNOSIS — K219 Gastro-esophageal reflux disease without esophagitis: Secondary | ICD-10-CM | POA: Diagnosis not present

## 2019-07-02 DIAGNOSIS — G43909 Migraine, unspecified, not intractable, without status migrainosus: Secondary | ICD-10-CM | POA: Diagnosis not present

## 2019-07-02 DIAGNOSIS — F419 Anxiety disorder, unspecified: Secondary | ICD-10-CM | POA: Diagnosis not present

## 2019-07-03 DIAGNOSIS — B3789 Other sites of candidiasis: Secondary | ICD-10-CM | POA: Diagnosis not present
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
## 2019-07-07 ENCOUNTER — Encounter: Payer: Self-pay | Admitting: Neurology

## 2019-07-08 ENCOUNTER — Telehealth (INDEPENDENT_AMBULATORY_CARE_PROVIDER_SITE_OTHER): Payer: Medicare PPO | Admitting: Neurology

## 2019-07-08 ENCOUNTER — Other Ambulatory Visit: Payer: Self-pay

## 2019-07-08 ENCOUNTER — Telehealth: Payer: Self-pay | Admitting: Neurology

## 2019-07-08 VITALS — Ht 61.5 in | Wt 174.0 lb

## 2019-07-08 DIAGNOSIS — G44209 Tension-type headache, unspecified, not intractable: Secondary | ICD-10-CM

## 2019-07-08 DIAGNOSIS — G20 Parkinson's disease: Secondary | ICD-10-CM

## 2019-07-08 DIAGNOSIS — F330 Major depressive disorder, recurrent, mild: Secondary | ICD-10-CM | POA: Diagnosis not present

## 2019-07-08 NOTE — Telephone Encounter (Signed)
Left message for patient to leave the medication on my voicemail and I will add it to her chart.

## 2019-07-08 NOTE — Telephone Encounter (Signed)
Xarelto 20mg - she wanted to add this into her med list. She also had 2 blood clots she wanted to document.  This is the reason that she is on the medication. Looks like this is already added to her chart but just wanted to make sure you knew she called back. Thanks!

## 2019-07-08 NOTE — Telephone Encounter (Signed)
Noted  

## 2019-07-08 NOTE — Telephone Encounter (Signed)
Patient left vm that she was needing to add a medication to her med list. She did not give the name of the medication just that she had an appt this morning. Please call her back at [phone]. Thanks!

## 2019-07-16 ENCOUNTER — Telehealth: Payer: Self-pay

## 2019-07-16 NOTE — Telephone Encounter (Signed)
Patient says she was referred to our office social worker by Dr. Arbutus Leas. Patient hasn't rec any calls from SW. Please call patient

## 2019-07-19 NOTE — Telephone Encounter (Signed)
I scheduled pt for 08/06/19 at 9am, thank you!

## 2019-08-05 ENCOUNTER — Ambulatory Visit (INDEPENDENT_AMBULATORY_CARE_PROVIDER_SITE_OTHER): Payer: Medicare PPO | Admitting: Clinical

## 2019-08-05 ENCOUNTER — Other Ambulatory Visit: Payer: Self-pay

## 2019-08-05 DIAGNOSIS — F330 Major depressive disorder, recurrent, mild: Secondary | ICD-10-CM | POA: Diagnosis not present

## 2019-08-06 ENCOUNTER — Institutional Professional Consult (permissible substitution): Payer: Medicare PPO | Admitting: Clinical

## 2019-08-06 NOTE — BH Specialist Note (Signed)
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 Pt presents today for behavioral health treatment of low-mood and increased communication difficulty in relationships with adult daughters, pt states sx worsened ~February 2021 at which time pt also stopped exercising  LCSW provided supportive counseling as pt shared interpersonal dynamics with daughters, LCSW offered CBT reframing at times which pt was able to appreciate. Pt responded receptively to intervention today. Pt strengths include current behavior activation, family support, insurance, motivation for care. Would likely benefit from sessions as needed per pt direction.   Brief Interventions provided today in session 1. Supportive Counseling 2. Brief-CBT  Plan 1. Consider boundaries between patient and care-partner 2.  Consider exercise opportunities available to get moving again 3. Practicing the pause in communication with daughter(s), self-care for psychological safety  Behavioral Health treatment recommendations communicated to referring provider and pt states agreement with plan. [facility] will remain available for future consultation.

## 2019-08-10 ENCOUNTER — Other Ambulatory Visit: Payer: Self-pay | Admitting: Neurology

## 2019-08-11 DIAGNOSIS — L820 Inflamed seborrheic keratosis: Secondary | ICD-10-CM | POA: Diagnosis not present

## 2019-08-11 DIAGNOSIS — D1801 Hemangioma of skin and subcutaneous tissue: Secondary | ICD-10-CM | POA: Diagnosis not present

## 2019-08-11 DIAGNOSIS — L209 Atopic dermatitis, unspecified: Secondary | ICD-10-CM | POA: Diagnosis not present

## 2019-08-19 ENCOUNTER — Ambulatory Visit: Payer: Medicare PPO | Admitting: Clinical

## 2019-08-19 ENCOUNTER — Other Ambulatory Visit: Payer: Self-pay

## 2019-08-20 ENCOUNTER — Ambulatory Visit: Payer: Medicare PPO | Admitting: Clinical

## 2019-08-22 DIAGNOSIS — L03115 Cellulitis of right lower limb: Secondary | ICD-10-CM | POA: Diagnosis not present

## 2019-08-25 ENCOUNTER — Ambulatory Visit: Payer: Medicare PPO | Admitting: Clinical

## 2019-08-30 DIAGNOSIS — M79671 Pain in right foot: Secondary | ICD-10-CM | POA: Diagnosis not present

## 2019-08-30 DIAGNOSIS — Z6831 Body mass index (BMI) 31.0-31.9, adult: Secondary | ICD-10-CM | POA: Diagnosis not present

## 2019-08-31 DIAGNOSIS — M8430XA Stress fracture, unspecified site, initial encounter for fracture: Secondary | ICD-10-CM | POA: Diagnosis not present

## 2019-08-31 DIAGNOSIS — M10071 Idiopathic gout, right ankle and foot: Secondary | ICD-10-CM | POA: Diagnosis not present

## 2019-08-31 DIAGNOSIS — M79671 Pain in right foot: Secondary | ICD-10-CM | POA: Diagnosis not present

## 2019-09-06 DIAGNOSIS — G20 Parkinson's disease: Secondary | ICD-10-CM | POA: Diagnosis not present

## 2019-09-06 DIAGNOSIS — M79671 Pain in right foot: Secondary | ICD-10-CM | POA: Diagnosis not present

## 2019-09-06 DIAGNOSIS — D692 Other nonthrombocytopenic purpura: Secondary | ICD-10-CM | POA: Diagnosis not present

## 2019-09-06 DIAGNOSIS — I700 Atherosclerosis of aorta: Secondary | ICD-10-CM | POA: Diagnosis not present

## 2019-09-06 DIAGNOSIS — D6862 Lupus anticoagulant syndrome: Secondary | ICD-10-CM | POA: Diagnosis not present

## 2019-09-06 DIAGNOSIS — D6869 Other thrombophilia: Secondary | ICD-10-CM | POA: Diagnosis not present

## 2019-09-07 DIAGNOSIS — M8430XA Stress fracture, unspecified site, initial encounter for fracture: Secondary | ICD-10-CM | POA: Diagnosis not present

## 2019-09-07 DIAGNOSIS — M79671 Pain in right foot: Secondary | ICD-10-CM | POA: Diagnosis not present

## 2019-09-14 DIAGNOSIS — M8430XA Stress fracture, unspecified site, initial encounter for fracture: Secondary | ICD-10-CM | POA: Diagnosis not present

## 2019-09-21 ENCOUNTER — Ambulatory Visit (INDEPENDENT_AMBULATORY_CARE_PROVIDER_SITE_OTHER): Payer: Medicare PPO | Admitting: Podiatry

## 2019-09-21 ENCOUNTER — Other Ambulatory Visit: Payer: Self-pay

## 2019-09-21 ENCOUNTER — Other Ambulatory Visit: Payer: Self-pay | Admitting: Podiatry

## 2019-09-21 ENCOUNTER — Ambulatory Visit (INDEPENDENT_AMBULATORY_CARE_PROVIDER_SITE_OTHER): Payer: Medicare PPO

## 2019-09-21 DIAGNOSIS — M109 Gout, unspecified: Secondary | ICD-10-CM

## 2019-09-21 DIAGNOSIS — M79671 Pain in right foot: Secondary | ICD-10-CM

## 2019-09-21 DIAGNOSIS — M21621 Bunionette of right foot: Secondary | ICD-10-CM | POA: Diagnosis not present

## 2019-09-21 NOTE — Patient Instructions (Signed)
Gout  Gout is a condition that causes painful swelling of the joints. Gout is a type of inflammation of the joints (arthritis). This condition is caused by having too much uric acid in the body. Uric acid is a chemical that forms when the body breaks down substances called purines. Purines are important for building body proteins. When the body has too much uric acid, sharp crystals can form and build up inside the joints. This causes pain and swelling. Gout attacks can happen quickly and may be very painful (acute gout). Over time, the attacks can affect more joints and become more frequent (chronic gout). Gout can also cause uric acid to build up under the skin and inside the kidneys. What are the causes? This condition is caused by too much uric acid in your blood. This can happen because:  Your kidneys do not remove enough uric acid from your blood. This is the most common cause.  Your body makes too much uric acid. This can happen with some cancers and cancer treatments. It can also occur if your body is breaking down too many red blood cells (hemolytic anemia).  You eat too many foods that are high in purines. These foods include organ meats and some seafood. Alcohol, especially beer, is also high in purines. A gout attack may be triggered by trauma or stress. What increases the risk? You are more likely to develop this condition if you:  Have a family history of gout.  Are female and middle-aged.  Are female and have gone through menopause.  Are obese.  Frequently drink alcohol, especially beer.  Are dehydrated.  Lose weight too quickly.  Have an organ transplant.  Have lead poisoning.  Take certain medicines, including aspirin, cyclosporine, diuretics, levodopa, and niacin.  Have kidney disease.  Have a skin condition called psoriasis. What are the signs or symptoms? An attack of acute gout happens quickly. It usually occurs in just one joint. The most common place is  the big toe. Attacks often start at night. Other joints that may be affected include joints of the feet, ankle, knee, fingers, wrist, or elbow. Symptoms of this condition may include:  Severe pain.  Warmth.  Swelling.  Stiffness.  Tenderness. The affected joint may be very painful to touch.  Shiny, red, or purple skin.  Chills and fever. Chronic gout may cause symptoms more frequently. More joints may be involved. You may also have white or yellow lumps (tophi) on your hands or feet or in other areas near your joints. How is this diagnosed? This condition is diagnosed based on your symptoms, medical history, and physical exam. You may have tests, such as:  Blood tests to measure uric acid levels.  Removal of joint fluid with a thin needle (aspiration) to look for uric acid crystals.  X-rays to look for joint damage. How is this treated? Treatment for this condition has two phases: treating an acute attack and preventing future attacks. Acute gout treatment may include medicines to reduce pain and swelling, including:  NSAIDs.  Steroids. These are strong anti-inflammatory medicines that can be taken by mouth (orally) or injected into a joint.  Colchicine. This medicine relieves pain and swelling when it is taken soon after an attack. It can be given by mouth or through an IV. Preventive treatment may include:  Daily use of smaller doses of NSAIDs or colchicine.  Use of a medicine that reduces uric acid levels in your blood.  Changes to your diet. You may   need to see a dietitian about what to eat and drink to prevent gout. Follow these instructions at home: During a gout attack   If directed, put ice on the affected area: ? Put ice in a plastic bag. ? Place a towel between your skin and the bag. ? Leave the ice on for 20 minutes, 2-3 times a day.  Raise (elevate) the affected joint above the level of your heart as often as possible.  Rest the joint as much as possible.  If the affected joint is in your leg, you may be given crutches to use.  Follow instructions from your health care provider about eating or drinking restrictions. Avoiding future gout attacks  Follow a low-purine diet as told by your dietitian or health care provider. Avoid foods and drinks that are high in purines, including liver, kidney, anchovies, asparagus, herring, mushrooms, mussels, and beer.  Maintain a healthy weight or lose weight if you are overweight. If you want to lose weight, talk with your health care provider. It is important that you do not lose weight too quickly.  Start or maintain an exercise program as told by your health care provider. Eating and drinking  Drink enough fluids to keep your urine pale yellow.  If you drink alcohol: ? Limit how much you use to:  0-1 drink a day for women.  0-2 drinks a day for men. ? Be aware of how much alcohol is in your drink. In the [place] one drink equals one 12 oz bottle of beer (355 mL) one 5 oz glass of wine (148 mL), or one 1 oz glass of hard liquor (44 mL). General instructions  Take over-the-counter and prescription medicines only as told by your health care provider.  Do not drive or use heavy machinery while taking prescription pain medicine.  Return to your normal activities as told by your health care provider. Ask your health care provider what activities are safe for you.  Keep all follow-up visits as told by your health care provider. This is important. Contact a health care provider if you have:  Another gout attack.  Continuing symptoms of a gout attack after 10 days of treatment.  Side effects from your medicines.  Chills or a fever.  Burning pain when you urinate.  Pain in your lower back or belly. Get help right away if you:  Have severe or uncontrolled pain.  Cannot urinate. Summary  Gout is painful swelling of the joints caused by inflammation.  The most common site of pain is the big  toe, but it can affect other joints in the body.  Medicines and dietary changes can help to prevent and treat gout attacks. This information is not intended to replace advice given to you by your health care provider. Make sure you discuss any questions you have with your health care provider. Document Revised: 11/05/2017 Document Reviewed: 11/05/2017 Elsevier Patient Education  2020 [employer]  

## 2019-09-21 NOTE — Progress Notes (Signed)
  Subjective:  Patient ID: Barbara Miranda, female    DOB: 1950/11/21,  MRN: [id number]  Chief Complaint  Patient presents with  . Foot Pain    Rt 5th toe joint redness, swelling and pain x 1 mo; 4-7/10 shapr pains -pt denie sinjury -wrose with wrong positions or movements -walking on inside of foot Tx: none -pt states Urgent care Rx 1 round of abx for redness and they tought it was cellulitis   69 y.o. female presents with the above complaint. History confirmed with patient. Was given antibiotics states it didn't do anything for the redness. Went to a podiatrist and was given a surgical shoe for possible stress fracture.  Objective:  Physical Exam: warm, good capillary refill, no trophic changes or ulcerative lesions, normal DP and PT pulses and normal sensory exam. Right Foot: Tailor's bunion right with local redness, swelling, pain on palpation of the 5th MPJ.    Assessment:   1. Acute gout of right foot, unspecified cause   2. Tailor's bunion of right foot   3. Right foot pain    Plan:  Patient was evaluated and treated and all questions answered.  Gout right foot -Educated on etiology -XR reviewed with patient -Injection delivered to the painful joint -Check UA -Avoid NSAIDs given Xarelto  Procedure: Joint Injection Location: Right 5th MPJ joint Skin Prep: Alcohol. Injectate: 0.5 cc 1% lidocaine plain, 0.5 cc dexamethasone phosphate. Disposition: Patient tolerated procedure well. Injection site dressed with a band-aid.  Return in about 3 weeks (around 10/12/2019) for Gout/Capsulitis f/u.

## 2019-09-22 DIAGNOSIS — M109 Gout, unspecified: Secondary | ICD-10-CM | POA: Diagnosis not present

## 2019-09-23 DIAGNOSIS — Z79899 Other long term (current) drug therapy: Secondary | ICD-10-CM | POA: Diagnosis not present

## 2019-09-23 DIAGNOSIS — L853 Xerosis cutis: Secondary | ICD-10-CM | POA: Diagnosis not present

## 2019-09-23 DIAGNOSIS — M79671 Pain in right foot: Secondary | ICD-10-CM | POA: Diagnosis not present

## 2019-09-23 DIAGNOSIS — Z6830 Body mass index (BMI) 30.0-30.9, adult: Secondary | ICD-10-CM | POA: Diagnosis not present

## 2019-09-23 LAB — URIC ACID: Uric Acid: 2.7 mg/dL — ABNORMAL LOW (ref 3.0–7.2)

## 2019-09-24 ENCOUNTER — Other Ambulatory Visit: Payer: Self-pay | Admitting: Podiatry

## 2019-09-24 ENCOUNTER — Telehealth: Payer: Self-pay | Admitting: *Deleted

## 2019-09-24 DIAGNOSIS — M109 Gout, unspecified: Secondary | ICD-10-CM

## 2019-09-24 NOTE — Telephone Encounter (Signed)
Pt asked if Dr. Ardelle Anton had heard if Levodopa caused gout.

## 2019-09-28 NOTE — Telephone Encounter (Signed)
This is Dr. Sherlene Shams patient but yes, Ldopa can elevate uric acid levels.

## 2019-10-05 DIAGNOSIS — M79671 Pain in right foot: Secondary | ICD-10-CM | POA: Diagnosis not present

## 2019-10-05 DIAGNOSIS — R600 Localized edema: Secondary | ICD-10-CM | POA: Diagnosis not present

## 2019-10-05 DIAGNOSIS — M19071 Primary osteoarthritis, right ankle and foot: Secondary | ICD-10-CM | POA: Diagnosis not present

## 2019-10-11 ENCOUNTER — Ambulatory Visit: Payer: Self-pay | Admitting: Podiatry

## 2019-10-12 ENCOUNTER — Ambulatory Visit: Payer: Medicare PPO | Admitting: Podiatry

## 2019-10-12 ENCOUNTER — Encounter: Payer: Self-pay | Admitting: Podiatry

## 2019-10-12 ENCOUNTER — Other Ambulatory Visit: Payer: Self-pay

## 2019-10-12 DIAGNOSIS — M79671 Pain in right foot: Secondary | ICD-10-CM | POA: Diagnosis not present

## 2019-10-12 DIAGNOSIS — M21621 Bunionette of right foot: Secondary | ICD-10-CM | POA: Diagnosis not present

## 2019-10-12 DIAGNOSIS — M109 Gout, unspecified: Secondary | ICD-10-CM

## 2019-10-12 NOTE — Progress Notes (Signed)
  Subjective:  Patient ID: Barbara Miranda, female    DOB: 07/22/1950,  MRN: [id number]  Chief Complaint  Patient presents with  . Gout    3WK F/U- pt states she is still in pain- pt is starting to have to walk on inside of right foot due to pain. injections seem to help   69 y.o. female presents with the above complaint. History confirmed with patient.  Objective:  Physical Exam: warm, good capillary refill, no trophic changes or ulcerative lesions, normal DP and PT pulses and normal sensory exam. Right Foot: Tailor's bunion right without local redness, swelling, no pain on palpation of the 5th MPJ.   Assessment:   1. Acute gout of right foot, unspecified cause   2. Tailor's bunion of right foot   3. Right foot pain    Plan:  Patient was evaluated and treated and all questions answered.  Gout right foot  -MRI reviewed. Though the MRI does suggest possible OM I think the findings are more consistent with gout. -Much improved. -Recommend voltaren gel to be applied 4x a day.  No follow-ups on file.

## 2019-10-13 DIAGNOSIS — M79671 Pain in right foot: Secondary | ICD-10-CM | POA: Diagnosis not present

## 2019-10-18 ENCOUNTER — Other Ambulatory Visit: Payer: Self-pay | Admitting: Radiology

## 2019-10-18 DIAGNOSIS — N6320 Unspecified lump in the left breast, unspecified quadrant: Secondary | ICD-10-CM

## 2019-10-22 ENCOUNTER — Ambulatory Visit
Admission: RE | Admit: 2019-10-22 | Discharge: 2019-10-22 | Disposition: A | Discharge status: Home / Self Care | Payer: Medicare PPO | Source: Ambulatory Visit | Attending: Radiology | Admitting: Radiology

## 2019-10-22 ENCOUNTER — Ambulatory Visit: Payer: Medicare PPO

## 2019-10-22 ENCOUNTER — Other Ambulatory Visit: Payer: Self-pay

## 2019-10-22 DIAGNOSIS — N6320 Unspecified lump in the left breast, unspecified quadrant: Secondary | ICD-10-CM

## 2019-10-22 DIAGNOSIS — R928 Other abnormal and inconclusive findings on diagnostic imaging of breast: Secondary | ICD-10-CM | POA: Diagnosis not present

## 2019-11-02 ENCOUNTER — Other Ambulatory Visit: Payer: Medicare PPO

## 2019-11-04 DIAGNOSIS — J0100 Acute maxillary sinusitis, unspecified: Secondary | ICD-10-CM | POA: Diagnosis not present

## 2019-11-04 DIAGNOSIS — F419 Anxiety disorder, unspecified: Secondary | ICD-10-CM | POA: Diagnosis not present

## 2019-11-04 DIAGNOSIS — G20 Parkinson's disease: Secondary | ICD-10-CM | POA: Diagnosis not present

## 2019-11-04 DIAGNOSIS — K219 Gastro-esophageal reflux disease without esophagitis: Secondary | ICD-10-CM | POA: Diagnosis not present

## 2019-11-04 DIAGNOSIS — R519 Headache, unspecified: Secondary | ICD-10-CM | POA: Diagnosis not present

## 2019-11-15 ENCOUNTER — Ambulatory Visit: Payer: Medicare PPO | Admitting: Podiatry

## 2019-11-21 ENCOUNTER — Other Ambulatory Visit: Payer: Self-pay | Admitting: Neurology

## 2019-11-22 DIAGNOSIS — N390 Urinary tract infection, site not specified: Secondary | ICD-10-CM | POA: Diagnosis not present

## 2020-01-05 ENCOUNTER — Other Ambulatory Visit: Payer: Self-pay | Admitting: Obstetrics and Gynecology

## 2020-01-05 DIAGNOSIS — H40013 Open angle with borderline findings, low risk, bilateral: Secondary | ICD-10-CM | POA: Diagnosis not present

## 2020-01-05 DIAGNOSIS — Z961 Presence of intraocular lens: Secondary | ICD-10-CM | POA: Diagnosis not present

## 2020-01-05 DIAGNOSIS — N6320 Unspecified lump in the left breast, unspecified quadrant: Secondary | ICD-10-CM

## 2020-01-05 DIAGNOSIS — H04123 Dry eye syndrome of bilateral lacrimal glands: Secondary | ICD-10-CM | POA: Diagnosis not present

## 2020-01-05 DIAGNOSIS — N6012 Diffuse cystic mastopathy of left breast: Secondary | ICD-10-CM | POA: Diagnosis not present
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
## 2020-01-11 ENCOUNTER — Ambulatory Visit: Payer: Medicare PPO | Admitting: Neurology

## 2020-01-11 ENCOUNTER — Other Ambulatory Visit: Payer: Self-pay

## 2020-01-11 ENCOUNTER — Encounter: Payer: Self-pay | Admitting: Neurology

## 2020-01-11 VITALS — BP 111/68 | HR 79 | Ht 61.0 in | Wt 174.0 lb

## 2020-01-11 DIAGNOSIS — G20 Parkinson's disease: Secondary | ICD-10-CM

## 2020-01-11 NOTE — Patient Instructions (Addendum)
1.  Increase exercise 2.  Continue carbidopa/levodopa 25/100, 1 tablet four times per day 3.  You look great!  Generally, your progression has been very slow with time and usually your greatest predictor of your future is your past progression.    The physicians and staff at [facility] are committed to providing excellent care. You may receive a survey requesting feedback about your experience at our office. We strive to receive "very good" responses to the survey questions. If you feel that your experience would prevent you from giving the office a "very good " response, please contact our office to try to remedy the situation. We may be reached at [phone]. Thank you for taking the time out of your busy day to complete the survey.

## 2020-01-19 ENCOUNTER — Ambulatory Visit: Payer: Medicare PPO

## 2020-01-19 ENCOUNTER — Other Ambulatory Visit: Payer: Self-pay

## 2020-01-19 ENCOUNTER — Ambulatory Visit
Admission: RE | Admit: 2020-01-19 | Discharge: 2020-01-19 | Disposition: A | Discharge status: Home / Self Care | Payer: Medicare PPO | Source: Ambulatory Visit | Attending: Obstetrics and Gynecology | Admitting: Obstetrics and Gynecology

## 2020-01-19 DIAGNOSIS — N6320 Unspecified lump in the left breast, unspecified quadrant: Secondary | ICD-10-CM

## 2020-01-19 DIAGNOSIS — R928 Other abnormal and inconclusive findings on diagnostic imaging of breast: Secondary | ICD-10-CM | POA: Diagnosis not present

## 2020-01-30 ENCOUNTER — Other Ambulatory Visit: Payer: Self-pay | Admitting: Neurology

## 2020-01-31 DIAGNOSIS — R059 Cough, unspecified: Secondary | ICD-10-CM | POA: Diagnosis not present

## 2020-01-31 DIAGNOSIS — Z20822 Contact with and (suspected) exposure to covid-19: Secondary | ICD-10-CM | POA: Diagnosis not present

## 2020-01-31 NOTE — Telephone Encounter (Signed)
Rx(s) sent to pharmacy electronically.  

## 2020-02-02 ENCOUNTER — Other Ambulatory Visit: Payer: Self-pay | Admitting: Orthopaedic Surgery

## 2020-02-02 DIAGNOSIS — M25562 Pain in left knee: Secondary | ICD-10-CM

## 2020-02-02 DIAGNOSIS — J0140 Acute pansinusitis, unspecified: Secondary | ICD-10-CM | POA: Diagnosis not present

## 2020-02-16 DIAGNOSIS — Z711 Person with feared health complaint in whom no diagnosis is made: Secondary | ICD-10-CM | POA: Diagnosis not present

## 2020-02-16 DIAGNOSIS — B370 Candidal stomatitis: Secondary | ICD-10-CM | POA: Diagnosis not present

## 2020-02-16 DIAGNOSIS — M159 Polyosteoarthritis, unspecified: Secondary | ICD-10-CM | POA: Diagnosis not present

## 2020-02-23 DIAGNOSIS — M79604 Pain in right leg: Secondary | ICD-10-CM | POA: Diagnosis not present

## 2020-02-23 DIAGNOSIS — R600 Localized edema: Secondary | ICD-10-CM | POA: Diagnosis not present

## 2020-03-03 ENCOUNTER — Other Ambulatory Visit: Payer: Self-pay | Admitting: Neurology

## 2020-03-03 NOTE — Telephone Encounter (Signed)
Rx(s) sent to pharmacy electronically.  

## 2020-03-20 DIAGNOSIS — Z6832 Body mass index (BMI) 32.0-32.9, adult: Secondary | ICD-10-CM | POA: Diagnosis not present

## 2020-03-20 DIAGNOSIS — Z124 Encounter for screening for malignant neoplasm of cervix: Secondary | ICD-10-CM | POA: Diagnosis not present

## 2020-03-20 DIAGNOSIS — Z779 Other contact with and (suspected) exposures hazardous to health: Secondary | ICD-10-CM | POA: Diagnosis not present

## 2020-04-03 DIAGNOSIS — J069 Acute upper respiratory infection, unspecified: Secondary | ICD-10-CM | POA: Diagnosis not present

## 2020-04-03 DIAGNOSIS — Z20822 Contact with and (suspected) exposure to covid-19: Secondary | ICD-10-CM | POA: Diagnosis not present

## 2020-04-10 DIAGNOSIS — T148XXA Other injury of unspecified body region, initial encounter: Secondary | ICD-10-CM | POA: Diagnosis not present

## 2020-04-10 DIAGNOSIS — R3915 Urgency of urination: Secondary | ICD-10-CM | POA: Diagnosis not present

## 2020-04-19 ENCOUNTER — Other Ambulatory Visit: Payer: Self-pay | Admitting: Obstetrics and Gynecology

## 2020-04-19 DIAGNOSIS — Z1231 Encounter for screening mammogram for malignant neoplasm of breast: Secondary | ICD-10-CM

## 2020-05-08 ENCOUNTER — Other Ambulatory Visit: Payer: Self-pay

## 2020-05-08 DIAGNOSIS — T8859XA Other complications of anesthesia, initial encounter: Secondary | ICD-10-CM | POA: Insufficient documentation

## 2020-05-08 DIAGNOSIS — F419 Anxiety disorder, unspecified: Secondary | ICD-10-CM | POA: Insufficient documentation

## 2020-05-08 DIAGNOSIS — I82409 Acute embolism and thrombosis of unspecified deep veins of unspecified lower extremity: Secondary | ICD-10-CM | POA: Insufficient documentation

## 2020-05-08 DIAGNOSIS — Z86718 Personal history of other venous thrombosis and embolism: Secondary | ICD-10-CM | POA: Insufficient documentation

## 2020-05-08 DIAGNOSIS — G43909 Migraine, unspecified, not intractable, without status migrainosus: Secondary | ICD-10-CM | POA: Insufficient documentation

## 2020-05-08 DIAGNOSIS — G43509 Persistent migraine aura without cerebral infarction, not intractable, without status migrainosus: Secondary | ICD-10-CM | POA: Insufficient documentation

## 2020-05-08 DIAGNOSIS — R251 Tremor, unspecified: Secondary | ICD-10-CM | POA: Insufficient documentation

## 2020-05-08 DIAGNOSIS — E559 Vitamin D deficiency, unspecified: Secondary | ICD-10-CM | POA: Insufficient documentation

## 2020-05-08 DIAGNOSIS — F32A Depression, unspecified: Secondary | ICD-10-CM | POA: Insufficient documentation

## 2020-05-08 DIAGNOSIS — M8580 Other specified disorders of bone density and structure, unspecified site: Secondary | ICD-10-CM | POA: Insufficient documentation

## 2020-05-10 ENCOUNTER — Ambulatory Visit: Payer: Medicare PPO | Admitting: Cardiology

## 2020-05-26 DIAGNOSIS — M546 Pain in thoracic spine: Secondary | ICD-10-CM | POA: Diagnosis not present

## 2020-05-26 DIAGNOSIS — Z6831 Body mass index (BMI) 31.0-31.9, adult: Secondary | ICD-10-CM | POA: Diagnosis not present

## 2020-05-29 ENCOUNTER — Ambulatory Visit: Payer: Medicare PPO | Admitting: Cardiology

## 2020-05-29 ENCOUNTER — Ambulatory Visit: Payer: Medicare PPO

## 2020-06-05 ENCOUNTER — Ambulatory Visit: Payer: Medicare PPO | Admitting: Cardiology

## 2020-06-26 DIAGNOSIS — N390 Urinary tract infection, site not specified: Secondary | ICD-10-CM | POA: Diagnosis not present

## 2020-06-27 ENCOUNTER — Other Ambulatory Visit: Payer: Self-pay

## 2020-06-27 ENCOUNTER — Ambulatory Visit: Payer: Medicare PPO | Admitting: Cardiology

## 2020-06-27 ENCOUNTER — Encounter: Payer: Self-pay | Admitting: Cardiology

## 2020-06-27 VITALS — BP 120/82 | HR 77 | Ht 61.0 in | Wt 167.4 lb

## 2020-06-27 DIAGNOSIS — G4733 Obstructive sleep apnea (adult) (pediatric): Secondary | ICD-10-CM | POA: Diagnosis not present

## 2020-06-27 DIAGNOSIS — E559 Vitamin D deficiency, unspecified: Secondary | ICD-10-CM

## 2020-06-27 DIAGNOSIS — I10 Essential (primary) hypertension: Secondary | ICD-10-CM

## 2020-06-27 NOTE — Patient Instructions (Addendum)
Medication Instructions:  Your physician recommends that you continue on your current medications as directed. Please refer to the Current Medication list given to you today.  *If you need a refill on your cardiac medications before your next appointment, please call your pharmacy*   Lab Work: Your physician recommends that you return for lab work: TODAY Vitamin D If you have labs (blood work) drawn today and your tests are completely normal, you will receive your results only by: . MyChart Message (if you have MyChart) OR . A paper copy in the mail If you have any lab test that is abnormal or we need to change your treatment, we will call you to review the results.   Testing/Procedures: None   Follow-Up: At [facility], you and your health needs are our priority.  As part of our continuing mission to provide you with exceptional heart care, we have created designated Provider Care Teams.  These Care Teams include your primary Cardiologist (physician) and Advanced Practice Providers (APPs -  Physician Assistants and Nurse Practitioners) who all work together to provide you with the care you need, when you need it.  We recommend signing up for the patient portal called "MyChart".  Sign up information is provided on this After Visit Summary.  MyChart is used to connect with patients for Virtual Visits (Telemedicine).  Patients are able to view lab/test results, encounter notes, upcoming appointments, etc.  Non-urgent messages can be sent to your provider as well.   To learn more about what you can do with MyChart, go to [email or web address].    Your next appointment:   1 year(s)  The format for your next appointment:   In Person  Provider:   Kardie Tobb, DO   Other Instructions   

## 2020-06-27 NOTE — Progress Notes (Signed)
Cardiology Office Note:    Date:  06/27/2020   ID:  Barbara Miranda, DOB [birth year], MRN [id number]  PCP:  Philemon Kingdom, MD  Cardiologist:  Thomasene Ripple, DO  Electrophysiologist:  None   Referring MD: Philemon Kingdom, MD   Have been having some fatigue  History of Present Illness:    Barbara Miranda is a 70 y.o. female with a hx of a hx of DVT with most recent recurrent clot next week ago (on Xarelto), history of syncope and is status post ILR implantation and removal and  per records she did not have any bradycardia or tachyarrhythmias.  I saw the patient on 06/25/2018 at that time she reported that she was experiencing shortness of breath and chest pain.  Echocardiogram was ordered as well as given concern for chest pain and coronary CTA was also ordered.  Her last visit was May 13, 2019 at that time she was doing well from a cardiovascular standpoint.  We discussed her testing results.  She is here today for follow-up visit.  Since I last saw her she has had COVID-19 infection but she recovered well.  But she has had some significant fatigue.  No chest pain or shortness of breath  Past Medical History:  Diagnosis Date  . Anxiety   . Complication of anesthesia    "alot of times my BP will be low when I waking up" (01/19/2015)  . Depression   . DVT (deep venous thrombosis) (HCC)   . GERD (gastroesophageal reflux disease)   . Migraine aura, persistent    "a couple times/yr" (01/19/2015)  . Parkinson's disease (HCC)   . Tremor   . Vitamin D deficiency     Past Surgical History:  Procedure Laterality Date  . ANTERIOR CERVICAL DECOMP/DISCECTOMY FUSION  2006   C5-6  . BACK SURGERY    . CARDIAC CATHETERIZATION  ~ 12/2013  . COMBINED HYSTEROSCOPY DIAGNOSTIC / D&C  03/2001  . EP IMPLANTABLE DEVICE N/A 01/20/2015   Procedure: Loop Recorder Insertion;  Surgeon: Marinus Maw, MD;  Location[facility] INVASIVE CV LAB;  Service: Cardiovascular;  Laterality: N/A;  . KNEE  ARTHROSCOPY Right 1995  . LAPAROSCOPIC CHOLECYSTECTOMY  ~ 2008  . NASAL SEPTUM SURGERY  ~ 1985  . ORIF ANKLE FRACTURE Left 01/21/2015   Procedure: OPEN REDUCTION INTERNAL FIXATION (ORIF) MEDIAL MALLEOLUS FRACTURE;  Surgeon: Myrene Galas, MD;  Location: [facility] OR;  Service: Orthopedics;  Laterality: Left;  . TUBAL LIGATION  1988    Current Medications: Current Meds  Medication Sig  . Biotin 1 MG CAPS Take 1 mg by mouth daily.   . carbidopa-levodopa (SINEMET IR) 25-100 MG tablet TAKE ONE TABLET BY MOUTH 4 TIMES DAILY  . cholecalciferol (VITAMIN D) 1000 UNITS tablet Take 2,000 Units by mouth daily.   Marland Kitchen FLUoxetine (PROZAC) 40 MG capsule Take 40 mg by mouth daily.   . fluticasone (FLONASE) 50 MCG/ACT nasal spray Place 1 spray into both nostrils daily.  . furosemide (LASIX) 20 MG tablet TAKE ONE TABLET BY MOUTH AS NEEDED FOR ankle swelling 30  . metFORMIN (GLUCOPHAGE) 500 MG tablet metformin 500 mg tablet  TAKE ONE TABLET BY MOUTH TWICE DAILY MUST HAVE APPOINTMENT  . nystatin cream (MYCOSTATIN) nystatin 100,000 unit/gram topical cream  APPLY TO THE AFFECTED AREA(S) BY TOPICAL ROUTE 2 TIMES PER DAY  . pantoprazole (PROTONIX) 40 MG tablet Take 40 mg by mouth daily.  Marland Kitchen sulfamethoxazole-trimethoprim (BACTRIM DS) 800-160 MG tablet   . topiramate (TOPAMAX) 50 MG tablet  Take 1 tablet (50 mg total) by mouth 2 (two) times daily.  Marland Kitchen. triamcinolone (KENALOG) 0.1 % triamcinolone acetonide 0.1 % topical cream  APPLY A THIN LAYER TO THE AFFECTED AREA(S) BY TOPICAL ROUTE 2 TIMES PER DAY  . XARELTO 20 MG TABS tablet Take 20 mg by mouth daily.     Allergies:   Mirapex [pramipexole dihydrochloride], Other, Pramipexole, Levofloxacin, Penicillins, Prednisone, and Propranolol   Social History   Socioeconomic History  . Marital status: Divorced    Spouse name: Not on file  . Number of children: 3  . Years of education: graduate classes  . Highest education level: Not on file  Occupational History  .  Occupation: retired  Tobacco Use  . Smoking status: Never Smoker  . Smokeless tobacco: Never Used  Vaping Use  . Vaping Use: Never used  Substance and Sexual Activity  . Alcohol use: No    Alcohol/week: 0.0 standard drinks  . Drug use: No  . Sexual activity: Never  Other Topics Concern  . Not on file  Social History Narrative   left handed   One story home    Lives alone    Social Determinants of Health   Financial Resource Strain: Not on file  Food Insecurity: Not on file  Transportation Needs: Not on file  Physical Activity: Not on file  Stress: Not on file  Social Connections: Not on file     Family History: The patient's family history includes Breast cancer in her paternal aunt and sister; Cancer in her brother, father, and sister; Stroke in her mother.  ROS:   Review of Systems  Constitution: Negative for decreased appetite, fever and weight gain.  HENT: Negative for congestion, ear discharge, hoarse voice and sore throat.   Eyes: Negative for discharge, redness, vision loss in right eye and visual halos.  Cardiovascular: Negative for chest pain, dyspnea on exertion, leg swelling, orthopnea and palpitations.  Respiratory: Negative for cough, hemoptysis, shortness of breath and snoring.   Endocrine: Negative for heat intolerance and polyphagia.  Hematologic/Lymphatic: Negative for bleeding problem. Does not bruise/bleed easily.  Skin: Negative for flushing, nail changes, rash and suspicious lesions.  Musculoskeletal: Negative for arthritis, joint pain, muscle cramps, myalgias, neck pain and stiffness.  Gastrointestinal: Negative for abdominal pain, bowel incontinence, diarrhea and excessive appetite.  Genitourinary: Negative for decreased libido, genital sores and incomplete emptying.  Neurological: Negative for brief paralysis, focal weakness, headaches and loss of balance.  Psychiatric/Behavioral: Negative for altered mental status, depression and suicidal ideas.   Allergic/Immunologic: Negative for HIV exposure and persistent infections.    EKGs/Labs/Other Studies Reviewed:    The following studies were reviewed today:   EKG: None today  Transthoracic echocardiogram done in November 2020 IMPRESSIONS    1. Left ventricular ejection fraction, by visual estimation, is 60 to  65%. The left ventricle has normal function. There is no left ventricular  hypertrophy.  2. Left ventricular diastolic parameters are consistent with Grade I  diastolic dysfunction (impaired relaxation).   FINDINGS  Left Ventricle: Left ventricular ejection fraction, by visual estimation,  is 60 to 65%. The left ventricle has normal function. There is no left  ventricular hypertrophy. Left ventricular diastolic parameters are  consistent with Grade I diastolic  dysfunction (impaired relaxation). Normal left atrial pressure.   Right Ventricle: The right ventricular size is normal. No increase in right ventricular wall thickness. Global RV systolic function is has normal systolic function.   Left Atrium: Left atrial size was normal  in size.   Right Atrium: Right atrial size was normal in size   Pericardium: There is no evidence of pericardial effusion.   Mitral Valve: The mitral valve is normal in structure. No evidence of mitral valve stenosis by observation. No evidence of mitral valve regurgitation.   Tricuspid Valve: The tricuspid valve is normal in structure. Tricuspid valve regurgitation is trivial.   Aortic Valve: The aortic valve is normal in structure. Aortic valve regurgitation is not visualized. The aortic valve is structurally normal,  with no evidence of sclerosis or stenosis.   Pulmonic Valve: The pulmonic valve was normal in structure. Pulmonic valve  regurgitation is not visualized.   Aorta: The aortic root, ascending aorta and aortic arch are all structurally normal, with no evidence of dilitation or obstruction.  Venous: The inferior vena  cava is normal in size with greater than 50%  respiratory variability, suggesting right atrial pressure of 3 mmHg.   IAS/Shunts: No atrial level shunt detected by color flow Doppler. No ventricular septal defect is seen or detected. There is no evidence of an atrial septal defect.   Coronary CTA December 2020 CT coronary FINDINGS: Non-cardiac: See separate report from [facility]. No significant findings on limited lung and soft tissue windows.  Calcium Score: One small isolated area of calcium in proximal LAD  Coronary Arteries: Right dominant with no anomalies  LM: Normal  LAD: 1-24% calcified plaque in proximal vessel  D1: Normal  D2: Normal  Circumflex: Normal  OM1: Normal  AV groove : Normal  RCA: Normal  PDA: Normal  PLA: Normal  IMPRESSION: 1. Calcium score 1 which is 48 th percentile for age and sex  2. Mild aortic root dilatation 3.8 cm  3. CAD RADS 1 non obstructive CAD see description above  Ct chest  IMPRESSION: 1. Small hiatal hernia. 2. Aortic Atherosclerosis (ICD10-I70.0). 3. Otherwise no significant extracardiac findings.   Recent Labs: No results found for requested labs within last 8760 hours.  Recent Lipid Panel No results found for: CHOL, TRIG, HDL, CHOLHDL, VLDL, LDLCALC, LDLDIRECT  Physical Exam:    VS:  BP 120/82   Pulse 77   Ht  (1.549 m)   Wt 167 lb 6.4 oz (75.9 kg)   SpO2 98%   BMI 31.63 kg/m     Wt Readings from Last 3 Encounters:  06/27/20 167 lb 6.4 oz (75.9 kg)  01/11/20 174 lb (78.9 kg)  07/07/19 174 lb (78.9 kg)     GEN: Well nourished, well developed in no acute distress HEENT: Normal NECK: No JVD; No carotid bruits LYMPHATICS: No lymphadenopathy CARDIAC: S1S2 noted,RRR, no murmurs, rubs, gallops RESPIRATORY:  Clear to auscultation without rales, wheezing or rhonchi  ABDOMEN: Soft, non-tender, non-distended, +bowel sounds, no guarding. EXTREMITIES: No edema, No cyanosis, no  clubbing MUSCULOSKELETAL:  No deformity  SKIN: Warm and dry NEUROLOGIC:  Alert and oriented x 3, non-focal PSYCHIATRIC:  Normal affect, good insight  ASSESSMENT:    1. Essential hypertension   2. Vitamin D deficiency   3. Obstructive sleep apnea syndrome    PLAN:     1.  Given her significant fatigue and history of vitamin D deficiency we will get vitamin D levels today.  2.  The patient understands the need to lose weight with diet and exercise. We have discussed specific strategies for this.  3.  Continue with CPAP  4.  Her blood pressure acceptable today in the office.  The patient is in agreement with the above  plan. The patient left the office in stable condition.  The patient will follow up in 1 year or sooner if needed.   Medication Adjustments/Labs and Tests Ordered: Current medicines are reviewed at length with the patient today.  Concerns regarding medicines are outlined above.  Orders Placed This Encounter  Procedures  . VITAMIN D 25 Hydroxy (Vit-D Deficiency, Fractures)  . EKG 12-Lead   No orders of the defined types were placed in this encounter.   Patient Instructions  Medication Instructions:  Your physician recommends that you continue on your current medications as directed. Please refer to the Current Medication list given to you today.  *If you need a refill on your cardiac medications before your next appointment, please call your pharmacy*   Lab Work: Your physician recommends that you return for lab work:  TODAY: Vitamin D If you have labs (blood work) drawn today and your tests are completely normal, you will receive your results only by: Marland Kitchen MyChart Message (if you have MyChart) OR . A paper copy in the mail If you have any lab test that is abnormal or we need to change your treatment, we will call you to review the results.   Testing/Procedures: None   Follow-Up: At [facility], you and your health needs are our priority.  As part of  our continuing mission to provide you with exceptional heart care, we have created designated Provider Care Teams.  These Care Teams include your primary Cardiologist (physician) and Advanced Practice Providers (APPs -  Physician Assistants and Nurse Practitioners) who all work together to provide you with the care you need, when you need it.  We recommend signing up for the patient portal called "MyChart".  Sign up information is provided on this After Visit Summary.  MyChart is used to connect with patients for Virtual Visits (Telemedicine).  Patients are able to view lab/test results, encounter notes, upcoming appointments, etc.  Non-urgent messages can be sent to your provider as well.   To learn more about what you can do with MyChart, go to [email or web address].    Your next appointment:   1 year(s)  The format for your next appointment:   In Person  Provider:   Thomasene Ripple, DO   Other Instructions      Adopting a Healthy Lifestyle.  Know what a healthy weight is for you (roughly BMI <25) and aim to maintain this   Aim for 7+ servings of fruits and vegetables daily   65-80+ fluid ounces of water or unsweet tea for healthy kidneys   Limit to max 1 drink of alcohol per day; avoid smoking/tobacco   Limit animal fats in diet for cholesterol and heart health - choose grass fed whenever available   Avoid highly processed foods, and foods high in saturated/trans fats   Aim for low stress - take time to unwind and care for your mental health   Aim for 150 min of moderate intensity exercise weekly for heart health, and weights twice weekly for bone health   Aim for 7-9 hours of sleep daily   When it comes to diets, agreement about the perfect plan isnt easy to find, even among the experts. Experts at the [facility] [employer] developed an idea known as the Healthy Eating Plate. Just imagine a plate divided into logical, healthy portions.   The emphasis is on diet  quality:   Load up on vegetables and fruits - one-half of your plate: Aim for color and variety,  and remember that potatoes dont count.   Go for whole grains - one-quarter of your plate: Whole wheat, barley, wheat berries, quinoa, oats, brown rice, and foods made with them. If you want pasta, go with whole wheat pasta.   Protein power - one-quarter of your plate: Fish, chicken, beans, and nuts are all healthy, versatile protein sources. Limit red meat.   The diet, however, does go beyond the plate, offering a few other suggestions.   Use healthy plant oils, such as olive, canola, soy, corn, sunflower and peanut. Check the labels, and avoid partially hydrogenated oil, which have unhealthy trans fats.   If youre thirsty, drink water. Coffee and tea are good in moderation, but skip sugary drinks and limit milk and dairy products to one or two daily servings.   The type of carbohydrate in the diet is more important than the amount. Some sources of carbohydrates, such as vegetables, fruits, whole grains, and beans-are healthier than others.   Finally, stay active  Signed, Thomasene Ripple, DO  06/27/2020 4:32 PM    [facility]

## 2020-06-28 LAB — VITAMIN D 25 HYDROXY (VIT D DEFICIENCY, FRACTURES): Vit D, 25-Hydroxy: 121 ng/mL — ABNORMAL HIGH (ref 30.0–100.0)
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
## 2020-07-03 ENCOUNTER — Ambulatory Visit: Payer: Medicare PPO | Admitting: Neurology

## 2020-07-03 ENCOUNTER — Telehealth: Payer: Self-pay | Admitting: Neurology

## 2020-07-03 ENCOUNTER — Other Ambulatory Visit: Payer: Self-pay

## 2020-07-03 ENCOUNTER — Encounter: Payer: Self-pay | Admitting: Neurology

## 2020-07-03 VITALS — BP 112/74 | HR 73 | Ht 61.5 in | Wt 164.0 lb

## 2020-07-03 DIAGNOSIS — G20 Parkinson's disease: Secondary | ICD-10-CM

## 2020-07-03 MED ORDER — TOPIRAMATE 50 MG PO TABS: 50.0000 mg | ORAL_TABLET | Freq: Two times a day (BID) | ORAL | 1 refills | Status: DC

## 2020-07-03 NOTE — Telephone Encounter (Signed)
[phone], [facility] phone number, per patient

## 2020-07-03 NOTE — Telephone Encounter (Signed)
Fax will be sent to [facility] in [place] Phone: [phone] Fax: [phone]
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
## 2020-07-03 NOTE — Patient Instructions (Signed)
Online Resources for Power over Parkinson's Group March 2022  . Local [place] Online Groups  o Power over Parkinson's Group :   - Power Over Parkinson's Patient Education Group will be Wednesday, March 9th at 2pm via Zoom.   - Upcoming Power over Parkinson's Meetings:  2nd Wednesdays of the month at 2 pm:       April 13th, May 11th - Contact Amy Marriott at [email or web address] if interested in participating in this online group o Parkinson's Care Partners Group:    3rd Mondays, Contact Sarah Chambers o Atypical Parkinsonian Patient Group:   4th Wednesdays, Contact Sarah Chambers o If you are interested in participating in these online groups with Sarah, please contact her directly for how to join those meetings.  Her contact information is [email or web address].  She will send you a link to join the Zoom meeting.  (Please note that Sarah Chambers , MSW, LCSW, has resigned her position at [employer], but will continue to lead the online groups temporarily)  . Parkinson Foundation:  www.parkinson.org o PD Health at Home continues:  Mindfulness Mondays, Expert Briefing Tuesdays, Wellness Wednesdays, Take Time Thursdays, Fitness Fridays -Listings for March 2022 are on the website o Upcoming Webinar:  Conversations about Complementary Therapies and PD.  Wednesday, March 2nd @ 1 pm o Upcoming Webinar:  Can We Put the Brakes on PD Progression?  Wednesday, April 6th @ 1 pm o Register for expert briefings (webinars) at [email or web address] o  Please check out their website to sign up for emails and see their full online offerings  . Michael J [employer] Foundation:  www.michaeljfox.org  o Upcoming Webinar:   Trouble Sleeping?  What to Know about Acting out Dreams and other Sleep Issues.  Thursday, March 17th @ 12 noon o Check out additional information on their website to see their full online offerings  . [place] Phinney Foundation:  www.[email or web address].[email or web address] o Upcoming  Webinar:  Women and Parkinson's.  Tuesday, March 8th at 2 pm o Care Partner Monthly Meetup.  With Connie Carpenter Phinney.  First Tuesday of each month, 2 pm o Check out additional information to Live Well Today on their website  . Parkinson and Movement Disorders (PMD) Alliance:  www.pmdalliance.org o [employer]:  Online Education Events o Sign up for emails, which are sent weekly to give you updates on programming and online offerings   . Parkinson's Association of [place]:  www.parkinsonassociation.org o Information on online support groups, education events, and online exercises including Yoga, Parkinson's exercises and more-LOTS of information on links to PD resources and online events o Virtual Support Group through Parkinson's Association of [place]; next one is scheduled for Wednesday, August 02, 2020 at 2 pm. (These are typically scheduled for the 1st Wednesday of the month at 2 pm).  Visit website for details.  . Additional links for movement activities: o PWR! Moves Classes at [facility] Exercise [facility] HAVE RESUMED!  Wednesdays 10 and 11 am.  Contact Amy Marriott, PT [email or web address] or [phone] if interested o Here is a link to the PWR!Moves classes on Zoom from [place] Parkinson's Foundation - Daily Mon-Sat at 10:00. Via Zoom, FREE and open to all.  There is also a link below via Facebook if you use that platform. - https://www.parkinsonsmi.org/mpf-programs/exercise-and-movement-activities - https://www.facebook.com/ParkinsonsMI.org/posts/pwr-moves-exercise-class-parkinson-wellness-recovery-online-with-angee-ludwa-pt-/10156827878021813/ o Parkinson's Wellness Recovery (PWR! Moves)  www.pwr4life.org - Info on the PWR! Virtual Experience:  You will have access to our expertise through self-assessment, guided plans that start with the PD-specific fundamentals, educational content, tips, Q&A with an expert,   and a growing library of PD-specific  pre-recorded and live exercise classes of varying types and intensity - both physical and cognitive! If that is not enough, we offer 1:1 wellness consultations (in-person or virtual) to personalize your PWR! Virtual Experience.  - Check out the PWR! Move of the month on the Parkinson Wellness Recovery website:  https://www.pwr4life.org/pwr-move-of-the-month-4/ o Parkinson Foundation Fitness Fridays:  - As part of the PD Health @ Home program, this free video series focuses each week on one aspect of fitness designed to support people living with Parkinson's.  These weekly videos highlight the Parkinson Foundation recent fitness guidelines for people with Parkinson's disease. -  www.parkinson.org/understanding-parkinsons/coronavirus/PD-health-at-home/Fitness-Fridays o Dance for PD website is offering free, live-stream classes throughout the week, as well as links to digital library of classes:  https://danceforparkinsons.org/ o Dance for Parkinson's Class:  [employer] [place].  Free offering for people with Parkinson's and care partners; virtual class.  o For more information, contact [phone] or email Magalli Morana at [email or web address] o Virtual dance and Pilates for Parkinson's classes: Click on the Community Tab> Parkinson's Movement Initiative Tab.  To register for classes and for more information, visit www.americandancefestival.org and click the "community" tab.     o [employer] Parkinson's Cycling Classes  - [facility]: 1pm on Fridays-Live classes at [employer] (Contact Beth McKinney at [email or web address] or [phone]) - [employer]: Virtual Classes Mondays and Thursdays (contact Priscilla at [email or web address] or [phone])   o [place] [employer] - Three levels of classes are offered Tuesdays and Thursdays:  10:30 am,  12 noon & 1:45 pm at [facility].  - Active Stretching with Maria, New Class starting in  March, on Fridays - To observe a class or for  more information, call [phone] or email [email or web address] . [facility]: o Online Caregiver Education Opportunities:  www.well-springsolutions.org/caregiver-education/caregiver-support-group.  You may also contact Jodi Kolada at [email or web address] or [phone].   o Virtual Caregiver Retreat, Monday, February 21st, 3-5 pm o Powerful Tools for Caregivers, 6-wk series for caregivers, in-person, Thursdays March 10, 17, 24, 31 and April 7, 14, from 10:30-12:30 at [facility] Conference Room, [address]  Contact Jodi Kolada (see above) to register o [facility] Navigator:  Just1Navigator program, a free service to help individuals and families through the journey of determining care for older adults.  The "Navigator" is a social worker, Nicole Reynolds, who will speak with a prospective client and/or loved ones to provide an assessment of the situation and a set of recommendations for a personalized care plan -- all free of charge, and whether [facility] offers the needed service or not. If the need is not a service we provide, we are well-connected with reputable programs in town that we can refer you to.  www.well-springsolutions.org or to speak with the Navigator, call [phone].  

## 2020-07-03 NOTE — Addendum Note (Signed)
Addended by: Kandice Robinsons T on: 07/03/2020 08:58 AM   Modules accepted: Orders

## 2020-07-03 NOTE — Telephone Encounter (Signed)
Patient called in stating the PT company she wants to use is [facility] [employer] on [address]. She said she had originally told Dr. Arbutus Leas a different company, but decided to change it.

## 2020-07-10 DIAGNOSIS — U099 Post covid-19 condition, unspecified: Secondary | ICD-10-CM | POA: Diagnosis not present

## 2020-07-10 DIAGNOSIS — R2689 Other abnormalities of gait and mobility: Secondary | ICD-10-CM | POA: Diagnosis not present

## 2020-07-10 DIAGNOSIS — M62551 Muscle wasting and atrophy, not elsewhere classified, right thigh: Secondary | ICD-10-CM | POA: Diagnosis not present

## 2020-07-10 DIAGNOSIS — M62552 Muscle wasting and atrophy, not elsewhere classified, left thigh: Secondary | ICD-10-CM | POA: Diagnosis not present

## 2020-07-10 DIAGNOSIS — G20 Parkinson's disease: Secondary | ICD-10-CM | POA: Diagnosis not present

## 2020-07-12 ENCOUNTER — Ambulatory Visit: Payer: Medicare PPO

## 2020-07-14 DIAGNOSIS — R2689 Other abnormalities of gait and mobility: Secondary | ICD-10-CM | POA: Diagnosis not present

## 2020-07-14 DIAGNOSIS — G20 Parkinson's disease: Secondary | ICD-10-CM | POA: Diagnosis not present

## 2020-07-14 DIAGNOSIS — M62552 Muscle wasting and atrophy, not elsewhere classified, left thigh: Secondary | ICD-10-CM | POA: Diagnosis not present

## 2020-07-14 DIAGNOSIS — M62551 Muscle wasting and atrophy, not elsewhere classified, right thigh: Secondary | ICD-10-CM | POA: Diagnosis not present

## 2020-07-14 DIAGNOSIS — U099 Post covid-19 condition, unspecified: Secondary | ICD-10-CM | POA: Diagnosis not present

## 2020-07-17 DIAGNOSIS — U099 Post covid-19 condition, unspecified: Secondary | ICD-10-CM | POA: Diagnosis not present

## 2020-07-17 DIAGNOSIS — M62552 Muscle wasting and atrophy, not elsewhere classified, left thigh: Secondary | ICD-10-CM | POA: Diagnosis not present

## 2020-07-17 DIAGNOSIS — G20 Parkinson's disease: Secondary | ICD-10-CM | POA: Diagnosis not present

## 2020-07-17 DIAGNOSIS — M62551 Muscle wasting and atrophy, not elsewhere classified, right thigh: Secondary | ICD-10-CM | POA: Diagnosis not present

## 2020-07-17 DIAGNOSIS — R2689 Other abnormalities of gait and mobility: Secondary | ICD-10-CM | POA: Diagnosis not present

## 2020-07-19 DIAGNOSIS — M62551 Muscle wasting and atrophy, not elsewhere classified, right thigh: Secondary | ICD-10-CM | POA: Diagnosis not present

## 2020-07-19 DIAGNOSIS — M62552 Muscle wasting and atrophy, not elsewhere classified, left thigh: Secondary | ICD-10-CM | POA: Diagnosis not present

## 2020-07-19 DIAGNOSIS — U099 Post covid-19 condition, unspecified: Secondary | ICD-10-CM | POA: Diagnosis not present

## 2020-07-19 DIAGNOSIS — G20 Parkinson's disease: Secondary | ICD-10-CM | POA: Diagnosis not present

## 2020-07-19 DIAGNOSIS — R2689 Other abnormalities of gait and mobility: Secondary | ICD-10-CM | POA: Diagnosis not present

## 2020-07-20 DIAGNOSIS — M62552 Muscle wasting and atrophy, not elsewhere classified, left thigh: Secondary | ICD-10-CM | POA: Diagnosis not present

## 2020-07-20 DIAGNOSIS — M62551 Muscle wasting and atrophy, not elsewhere classified, right thigh: Secondary | ICD-10-CM | POA: Diagnosis not present

## 2020-07-20 DIAGNOSIS — U099 Post covid-19 condition, unspecified: Secondary | ICD-10-CM | POA: Diagnosis not present

## 2020-07-20 DIAGNOSIS — R2689 Other abnormalities of gait and mobility: Secondary | ICD-10-CM | POA: Diagnosis not present

## 2020-07-20 DIAGNOSIS — G20 Parkinson's disease: Secondary | ICD-10-CM | POA: Diagnosis not present

## 2020-07-24 DIAGNOSIS — M62552 Muscle wasting and atrophy, not elsewhere classified, left thigh: Secondary | ICD-10-CM | POA: Diagnosis not present

## 2020-07-24 DIAGNOSIS — M62551 Muscle wasting and atrophy, not elsewhere classified, right thigh: Secondary | ICD-10-CM | POA: Diagnosis not present

## 2020-07-24 DIAGNOSIS — R2689 Other abnormalities of gait and mobility: Secondary | ICD-10-CM | POA: Diagnosis not present

## 2020-07-24 DIAGNOSIS — G20 Parkinson's disease: Secondary | ICD-10-CM | POA: Diagnosis not present

## 2020-07-24 DIAGNOSIS — U099 Post covid-19 condition, unspecified: Secondary | ICD-10-CM | POA: Diagnosis not present

## 2020-07-25 DIAGNOSIS — U099 Post covid-19 condition, unspecified: Secondary | ICD-10-CM | POA: Diagnosis not present

## 2020-07-25 DIAGNOSIS — M62552 Muscle wasting and atrophy, not elsewhere classified, left thigh: Secondary | ICD-10-CM | POA: Diagnosis not present

## 2020-07-25 DIAGNOSIS — G20 Parkinson's disease: Secondary | ICD-10-CM | POA: Diagnosis not present

## 2020-07-25 DIAGNOSIS — Z1231 Encounter for screening mammogram for malignant neoplasm of breast: Secondary | ICD-10-CM | POA: Diagnosis not present

## 2020-07-25 DIAGNOSIS — R2689 Other abnormalities of gait and mobility: Secondary | ICD-10-CM | POA: Diagnosis not present

## 2020-07-25 DIAGNOSIS — M62551 Muscle wasting and atrophy, not elsewhere classified, right thigh: Secondary | ICD-10-CM | POA: Diagnosis not present

## 2020-07-27 DIAGNOSIS — U099 Post covid-19 condition, unspecified: Secondary | ICD-10-CM | POA: Diagnosis not present

## 2020-07-27 DIAGNOSIS — M62551 Muscle wasting and atrophy, not elsewhere classified, right thigh: Secondary | ICD-10-CM | POA: Diagnosis not present

## 2020-07-27 DIAGNOSIS — M62552 Muscle wasting and atrophy, not elsewhere classified, left thigh: Secondary | ICD-10-CM | POA: Diagnosis not present

## 2020-07-27 DIAGNOSIS — G20 Parkinson's disease: Secondary | ICD-10-CM | POA: Diagnosis not present

## 2020-07-27 DIAGNOSIS — R2689 Other abnormalities of gait and mobility: Secondary | ICD-10-CM | POA: Diagnosis not present

## 2020-08-01 DIAGNOSIS — M62551 Muscle wasting and atrophy, not elsewhere classified, right thigh: Secondary | ICD-10-CM | POA: Diagnosis not present

## 2020-08-01 DIAGNOSIS — R2689 Other abnormalities of gait and mobility: Secondary | ICD-10-CM | POA: Diagnosis not present

## 2020-08-01 DIAGNOSIS — M62552 Muscle wasting and atrophy, not elsewhere classified, left thigh: Secondary | ICD-10-CM | POA: Diagnosis not present

## 2020-08-01 DIAGNOSIS — G20 Parkinson's disease: Secondary | ICD-10-CM | POA: Diagnosis not present

## 2020-08-01 DIAGNOSIS — U099 Post covid-19 condition, unspecified: Secondary | ICD-10-CM | POA: Diagnosis not present

## 2020-08-03 DIAGNOSIS — M62551 Muscle wasting and atrophy, not elsewhere classified, right thigh: Secondary | ICD-10-CM | POA: Diagnosis not present

## 2020-08-03 DIAGNOSIS — U099 Post covid-19 condition, unspecified: Secondary | ICD-10-CM | POA: Diagnosis not present

## 2020-08-03 DIAGNOSIS — M62552 Muscle wasting and atrophy, not elsewhere classified, left thigh: Secondary | ICD-10-CM | POA: Diagnosis not present

## 2020-08-03 DIAGNOSIS — R2689 Other abnormalities of gait and mobility: Secondary | ICD-10-CM | POA: Diagnosis not present

## 2020-08-03 DIAGNOSIS — G20 Parkinson's disease: Secondary | ICD-10-CM | POA: Diagnosis not present

## 2020-08-08 DIAGNOSIS — M62551 Muscle wasting and atrophy, not elsewhere classified, right thigh: Secondary | ICD-10-CM | POA: Diagnosis not present

## 2020-08-08 DIAGNOSIS — U099 Post covid-19 condition, unspecified: Secondary | ICD-10-CM | POA: Diagnosis not present

## 2020-08-08 DIAGNOSIS — R2689 Other abnormalities of gait and mobility: Secondary | ICD-10-CM | POA: Diagnosis not present

## 2020-08-08 DIAGNOSIS — G20 Parkinson's disease: Secondary | ICD-10-CM | POA: Diagnosis not present

## 2020-08-08 DIAGNOSIS — M62552 Muscle wasting and atrophy, not elsewhere classified, left thigh: Secondary | ICD-10-CM | POA: Diagnosis not present

## 2020-08-10 DIAGNOSIS — U099 Post covid-19 condition, unspecified: Secondary | ICD-10-CM | POA: Diagnosis not present

## 2020-08-10 DIAGNOSIS — R2689 Other abnormalities of gait and mobility: Secondary | ICD-10-CM | POA: Diagnosis not present

## 2020-08-10 DIAGNOSIS — M62551 Muscle wasting and atrophy, not elsewhere classified, right thigh: Secondary | ICD-10-CM | POA: Diagnosis not present

## 2020-08-10 DIAGNOSIS — M62552 Muscle wasting and atrophy, not elsewhere classified, left thigh: Secondary | ICD-10-CM | POA: Diagnosis not present

## 2020-08-10 DIAGNOSIS — G20 Parkinson's disease: Secondary | ICD-10-CM | POA: Diagnosis not present

## 2020-08-11 DIAGNOSIS — R0989 Other specified symptoms and signs involving the circulatory and respiratory systems: Secondary | ICD-10-CM | POA: Diagnosis not present

## 2020-08-11 DIAGNOSIS — M542 Cervicalgia: Secondary | ICD-10-CM | POA: Diagnosis not present

## 2020-08-14 DIAGNOSIS — R1310 Dysphagia, unspecified: Secondary | ICD-10-CM | POA: Diagnosis not present

## 2020-08-17 DIAGNOSIS — U099 Post covid-19 condition, unspecified: Secondary | ICD-10-CM | POA: Diagnosis not present

## 2020-08-17 DIAGNOSIS — M62552 Muscle wasting and atrophy, not elsewhere classified, left thigh: Secondary | ICD-10-CM | POA: Diagnosis not present

## 2020-08-17 DIAGNOSIS — R2689 Other abnormalities of gait and mobility: Secondary | ICD-10-CM | POA: Diagnosis not present

## 2020-08-17 DIAGNOSIS — G20 Parkinson's disease: Secondary | ICD-10-CM | POA: Diagnosis not present

## 2020-08-17 DIAGNOSIS — M62551 Muscle wasting and atrophy, not elsewhere classified, right thigh: Secondary | ICD-10-CM | POA: Diagnosis not present

## 2020-08-21 DIAGNOSIS — S20211A Contusion of right front wall of thorax, initial encounter: Secondary | ICD-10-CM | POA: Diagnosis not present

## 2020-08-22 DIAGNOSIS — M62551 Muscle wasting and atrophy, not elsewhere classified, right thigh: Secondary | ICD-10-CM | POA: Diagnosis not present

## 2020-08-22 DIAGNOSIS — G20 Parkinson's disease: Secondary | ICD-10-CM | POA: Diagnosis not present

## 2020-08-22 DIAGNOSIS — U099 Post covid-19 condition, unspecified: Secondary | ICD-10-CM | POA: Diagnosis not present

## 2020-08-22 DIAGNOSIS — R2689 Other abnormalities of gait and mobility: Secondary | ICD-10-CM | POA: Diagnosis not present

## 2020-08-22 DIAGNOSIS — M62552 Muscle wasting and atrophy, not elsewhere classified, left thigh: Secondary | ICD-10-CM | POA: Diagnosis not present

## 2020-08-24 DIAGNOSIS — G20 Parkinson's disease: Secondary | ICD-10-CM | POA: Diagnosis not present

## 2020-08-24 DIAGNOSIS — M62552 Muscle wasting and atrophy, not elsewhere classified, left thigh: Secondary | ICD-10-CM | POA: Diagnosis not present

## 2020-08-24 DIAGNOSIS — U099 Post covid-19 condition, unspecified: Secondary | ICD-10-CM | POA: Diagnosis not present

## 2020-08-24 DIAGNOSIS — M62551 Muscle wasting and atrophy, not elsewhere classified, right thigh: Secondary | ICD-10-CM | POA: Diagnosis not present

## 2020-08-24 DIAGNOSIS — R2689 Other abnormalities of gait and mobility: Secondary | ICD-10-CM | POA: Diagnosis not present

## 2020-08-29 DIAGNOSIS — M62551 Muscle wasting and atrophy, not elsewhere classified, right thigh: Secondary | ICD-10-CM | POA: Diagnosis not present

## 2020-08-29 DIAGNOSIS — M62552 Muscle wasting and atrophy, not elsewhere classified, left thigh: Secondary | ICD-10-CM | POA: Diagnosis not present

## 2020-08-29 DIAGNOSIS — R2689 Other abnormalities of gait and mobility: Secondary | ICD-10-CM | POA: Diagnosis not present

## 2020-08-29 DIAGNOSIS — G20 Parkinson's disease: Secondary | ICD-10-CM | POA: Diagnosis not present

## 2020-08-29 DIAGNOSIS — U099 Post covid-19 condition, unspecified: Secondary | ICD-10-CM | POA: Diagnosis not present

## 2020-08-31 DIAGNOSIS — G20 Parkinson's disease: Secondary | ICD-10-CM | POA: Diagnosis not present

## 2020-08-31 DIAGNOSIS — M62552 Muscle wasting and atrophy, not elsewhere classified, left thigh: Secondary | ICD-10-CM | POA: Diagnosis not present

## 2020-08-31 DIAGNOSIS — R2689 Other abnormalities of gait and mobility: Secondary | ICD-10-CM | POA: Diagnosis not present

## 2020-08-31 DIAGNOSIS — U099 Post covid-19 condition, unspecified: Secondary | ICD-10-CM | POA: Diagnosis not present

## 2020-08-31 DIAGNOSIS — M62551 Muscle wasting and atrophy, not elsewhere classified, right thigh: Secondary | ICD-10-CM | POA: Diagnosis not present

## 2020-09-04 ENCOUNTER — Ambulatory Visit: Payer: Medicare PPO

## 2020-09-07 DIAGNOSIS — G20 Parkinson's disease: Secondary | ICD-10-CM | POA: Diagnosis not present

## 2020-09-07 DIAGNOSIS — U099 Post covid-19 condition, unspecified: Secondary | ICD-10-CM | POA: Diagnosis not present

## 2020-09-07 DIAGNOSIS — M62551 Muscle wasting and atrophy, not elsewhere classified, right thigh: Secondary | ICD-10-CM | POA: Diagnosis not present

## 2020-09-07 DIAGNOSIS — R2689 Other abnormalities of gait and mobility: Secondary | ICD-10-CM | POA: Diagnosis not present

## 2020-09-07 DIAGNOSIS — M62552 Muscle wasting and atrophy, not elsewhere classified, left thigh: Secondary | ICD-10-CM | POA: Diagnosis not present

## 2020-09-12 DIAGNOSIS — G20 Parkinson's disease: Secondary | ICD-10-CM | POA: Diagnosis not present

## 2020-09-12 DIAGNOSIS — R2689 Other abnormalities of gait and mobility: Secondary | ICD-10-CM | POA: Diagnosis not present

## 2020-09-12 DIAGNOSIS — U099 Post covid-19 condition, unspecified: Secondary | ICD-10-CM | POA: Diagnosis not present

## 2020-09-12 DIAGNOSIS — M62551 Muscle wasting and atrophy, not elsewhere classified, right thigh: Secondary | ICD-10-CM | POA: Diagnosis not present

## 2020-09-12 DIAGNOSIS — M62552 Muscle wasting and atrophy, not elsewhere classified, left thigh: Secondary | ICD-10-CM | POA: Diagnosis not present

## 2020-09-14 DIAGNOSIS — M62552 Muscle wasting and atrophy, not elsewhere classified, left thigh: Secondary | ICD-10-CM | POA: Diagnosis not present

## 2020-09-14 DIAGNOSIS — U099 Post covid-19 condition, unspecified: Secondary | ICD-10-CM | POA: Diagnosis not present

## 2020-09-14 DIAGNOSIS — M62551 Muscle wasting and atrophy, not elsewhere classified, right thigh: Secondary | ICD-10-CM | POA: Diagnosis not present

## 2020-09-14 DIAGNOSIS — R2689 Other abnormalities of gait and mobility: Secondary | ICD-10-CM | POA: Diagnosis not present

## 2020-09-14 DIAGNOSIS — G20 Parkinson's disease: Secondary | ICD-10-CM | POA: Diagnosis not present

## 2020-09-19 DIAGNOSIS — M722 Plantar fascial fibromatosis: Secondary | ICD-10-CM | POA: Diagnosis not present

## 2020-09-19 DIAGNOSIS — Z6832 Body mass index (BMI) 32.0-32.9, adult: Secondary | ICD-10-CM | POA: Diagnosis not present

## 2020-09-21 DIAGNOSIS — M62552 Muscle wasting and atrophy, not elsewhere classified, left thigh: Secondary | ICD-10-CM | POA: Diagnosis not present

## 2020-09-21 DIAGNOSIS — G20 Parkinson's disease: Secondary | ICD-10-CM | POA: Diagnosis not present

## 2020-09-21 DIAGNOSIS — U099 Post covid-19 condition, unspecified: Secondary | ICD-10-CM | POA: Diagnosis not present

## 2020-09-21 DIAGNOSIS — R2689 Other abnormalities of gait and mobility: Secondary | ICD-10-CM | POA: Diagnosis not present

## 2020-09-21 DIAGNOSIS — M62551 Muscle wasting and atrophy, not elsewhere classified, right thigh: Secondary | ICD-10-CM | POA: Diagnosis not present

## 2020-09-26 DIAGNOSIS — M62551 Muscle wasting and atrophy, not elsewhere classified, right thigh: Secondary | ICD-10-CM | POA: Diagnosis not present

## 2020-09-26 DIAGNOSIS — U099 Post covid-19 condition, unspecified: Secondary | ICD-10-CM | POA: Diagnosis not present

## 2020-09-26 DIAGNOSIS — R2689 Other abnormalities of gait and mobility: Secondary | ICD-10-CM | POA: Diagnosis not present

## 2020-09-26 DIAGNOSIS — G20 Parkinson's disease: Secondary | ICD-10-CM | POA: Diagnosis not present

## 2020-09-26 DIAGNOSIS — M62552 Muscle wasting and atrophy, not elsewhere classified, left thigh: Secondary | ICD-10-CM | POA: Diagnosis not present

## 2020-09-28 DIAGNOSIS — R2689 Other abnormalities of gait and mobility: Secondary | ICD-10-CM | POA: Diagnosis not present

## 2020-09-28 DIAGNOSIS — M62551 Muscle wasting and atrophy, not elsewhere classified, right thigh: Secondary | ICD-10-CM | POA: Diagnosis not present

## 2020-09-28 DIAGNOSIS — M62552 Muscle wasting and atrophy, not elsewhere classified, left thigh: Secondary | ICD-10-CM | POA: Diagnosis not present

## 2020-09-28 DIAGNOSIS — U099 Post covid-19 condition, unspecified: Secondary | ICD-10-CM | POA: Diagnosis not present

## 2020-09-28 DIAGNOSIS — G20 Parkinson's disease: Secondary | ICD-10-CM | POA: Diagnosis not present

## 2020-10-11 DIAGNOSIS — N390 Urinary tract infection, site not specified: Secondary | ICD-10-CM | POA: Diagnosis not present

## 2020-10-27 ENCOUNTER — Ambulatory Visit: Payer: Medicare PPO | Admitting: Neurology

## 2020-11-06 DIAGNOSIS — W19XXXA Unspecified fall, initial encounter: Secondary | ICD-10-CM | POA: Diagnosis not present

## 2020-11-06 DIAGNOSIS — M546 Pain in thoracic spine: Secondary | ICD-10-CM | POA: Diagnosis not present

## 2020-11-13 ENCOUNTER — Telehealth: Payer: Self-pay | Admitting: Neurology

## 2020-11-13 DIAGNOSIS — W19XXXA Unspecified fall, initial encounter: Secondary | ICD-10-CM | POA: Diagnosis not present

## 2020-11-13 DIAGNOSIS — N390 Urinary tract infection, site not specified: Secondary | ICD-10-CM | POA: Diagnosis not present

## 2020-11-13 DIAGNOSIS — R296 Repeated falls: Secondary | ICD-10-CM | POA: Diagnosis not present

## 2020-11-13 DIAGNOSIS — M546 Pain in thoracic spine: Secondary | ICD-10-CM | POA: Diagnosis not present

## 2020-11-13 MED ORDER — CARBIDOPA-LEVODOPA 25-100 MG PO TABS: 1.5000 | ORAL_TABLET | Freq: Four times a day (QID) | ORAL | 0 refills | Status: DC

## 2020-11-13 NOTE — Telephone Encounter (Signed)
Left messgae at 310 11/07/2020

## 2020-11-13 NOTE — Telephone Encounter (Signed)
Glad she is seeing PCP to make sure nothing else like UTI going on Increase carbidopa/levodopa 25/100 to 1.5 tablets at 7am/11am/3pm/7pm.  I sent new RX to pharmacy Ask her if she wants RX for PT.

## 2020-11-13 NOTE — Telephone Encounter (Signed)
Pt is rtn Barbara Miranda call. Said she will keep her phone on her.

## 2020-11-13 NOTE — Telephone Encounter (Signed)
Patient called wanting Dr. Don Perking recommendations. She said her balance and her tremors seemed to have gotten much worse over the last few weeks.  Patient also reported she fell last night and will see her PCP to follow up today.

## 2020-11-14 ENCOUNTER — Other Ambulatory Visit: Payer: Self-pay

## 2020-11-14 DIAGNOSIS — M546 Pain in thoracic spine: Secondary | ICD-10-CM | POA: Diagnosis not present

## 2020-11-14 DIAGNOSIS — G20 Parkinson's disease: Secondary | ICD-10-CM

## 2020-11-14 NOTE — Telephone Encounter (Signed)
Send/fax referral

## 2020-11-14 NOTE — Telephone Encounter (Signed)
Patient would like to go to PT in [place] [facility].

## 2020-11-14 NOTE — Telephone Encounter (Signed)
[facility] [place] fax number [phone], phone [phone].

## 2020-12-05 DIAGNOSIS — R220 Localized swelling, mass and lump, head: Secondary | ICD-10-CM | POA: Diagnosis not present

## 2020-12-06 DIAGNOSIS — Z6830 Body mass index (BMI) 30.0-30.9, adult: Secondary | ICD-10-CM | POA: Diagnosis not present

## 2020-12-06 DIAGNOSIS — R220 Localized swelling, mass and lump, head: Secondary | ICD-10-CM | POA: Diagnosis not present

## 2020-12-12 DIAGNOSIS — J028 Acute pharyngitis due to other specified organisms: Secondary | ICD-10-CM | POA: Diagnosis not present

## 2020-12-12 DIAGNOSIS — Z20822 Contact with and (suspected) exposure to covid-19: Secondary | ICD-10-CM | POA: Diagnosis not present

## 2020-12-12 DIAGNOSIS — R590 Localized enlarged lymph nodes: Secondary | ICD-10-CM | POA: Diagnosis not present

## 2020-12-17 ENCOUNTER — Other Ambulatory Visit: Payer: Self-pay | Admitting: Neurology

## 2020-12-18 DIAGNOSIS — R220 Localized swelling, mass and lump, head: Secondary | ICD-10-CM | POA: Diagnosis not present

## 2020-12-18 DIAGNOSIS — K219 Gastro-esophageal reflux disease without esophagitis: Secondary | ICD-10-CM | POA: Diagnosis not present

## 2020-12-18 DIAGNOSIS — R221 Localized swelling, mass and lump, neck: Secondary | ICD-10-CM | POA: Diagnosis not present

## 2020-12-21 DIAGNOSIS — R590 Localized enlarged lymph nodes: Secondary | ICD-10-CM | POA: Diagnosis not present

## 2020-12-21 DIAGNOSIS — R221 Localized swelling, mass and lump, neck: Secondary | ICD-10-CM | POA: Diagnosis not present
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
## 2021-01-03 ENCOUNTER — Other Ambulatory Visit: Payer: Self-pay

## 2021-01-03 ENCOUNTER — Ambulatory Visit: Payer: Medicare PPO | Admitting: Neurology

## 2021-01-03 ENCOUNTER — Encounter: Payer: Self-pay | Admitting: Neurology

## 2021-01-03 VITALS — BP 123/78 | HR 71 | Ht 61.0 in | Wt 155.2 lb

## 2021-01-03 DIAGNOSIS — G249 Dystonia, unspecified: Secondary | ICD-10-CM

## 2021-01-03 DIAGNOSIS — G20 Parkinson's disease: Secondary | ICD-10-CM | POA: Diagnosis not present

## 2021-01-03 MED ORDER — TOPIRAMATE 50 MG PO TABS: 50.0000 mg | ORAL_TABLET | Freq: Two times a day (BID) | ORAL | 1 refills | Status: DC

## 2021-01-03 NOTE — Patient Instructions (Signed)
Online Resources for Power over Parkinson's Group September 2022  Local  Online Groups  Power over Parkinson's Group :   Power Over Parkinson's Patient Education Group will be Wednesday, September 14th-*Hybrid meting*- in person at [place] location and via WEBEX at 2:00 pm.   Upcoming Power over Parkinson's Meetings:  2nd Wednesdays of the month at 2 pm:  August 10th, September 14th Contact Amy Marriott at [email or web address] if interested in participating in this online group Parkinson's Care Partners Group:    3rd Mondays, Contact Misty Paladino Atypical Parkinsonian Patient Group:   4th Wednesdays, Contact Misty Paladino If you are interested in participating in these online groups with Misty, please contact her directly for how to join those meetings.  Her contact information is [email or web address].   Parkinson Foundation:  www.parkinson.org PD Health at Home continues:  Mindfulness Mondays, Expert Briefing Tuesdays, Wellness Wednesdays, Take Time Thursdays, Fitness Fridays -Listings for June 2022 are on the website Upcoming Webinar:  Use it or Lose It-The Impact of Physical Activity in Parkinson's.  Wednesday, September 7th at 1 pm. Upcoming Webinar:  Understanding Gene and Cell-Based Therapies in Parkinson's.  Wednesday, October 5th at 1 pm Register for expert briefings (webinars) at https://www.parkinson.org/resources-support/online-education/expert-briefings-webinars  Please check out their website to sign up for emails and see their full online offerings  [employer] Foundation:  www.michaeljfox.org  Upcoming Webinar:   Finding your Way:  Working through Emotions in the Early Years with Parkinson's.  Thursday, September 15th at 12 noon Check out additional information on their website to see their full online offerings  [employer] Phinney Foundation:  www.davisphinneyfoundation.org Upcoming Webinar:  Pelvic Health and Living with  Parkinson's.  Tuesday, September 20th at 3 pm.  Care Partner Monthly Meetup.  With Connie Carpenter Phinney.  First Tuesday of each month, 2 pm Joy Breaks:  First Wednesday of each month, 2-3 pm. There will be art, doodling, making, crafting, listening, laughing, stories, and everything in between. No art experience necessary. No supplies required. Just show up for joy!  Register on their website. Check out additional information to Live Well Today on their website  Parkinson and Movement Disorders (PMD) Alliance:  www.pmdalliance.org [employer] Online:  Online Education Events Sign up for emails, which are sent weekly to give you updates on programming and online offerings  Parkinson's Association of [place]:  www.parkinsonassociation.org Caring for Parkinson's, Caring for You Symposium, Saturday, September 10th.  [place], [place].  Symposium Registration - Parkinson Association of [place] Information on online support groups, education events, and online exercises including Yoga, Parkinson's exercises and more-LOTS of information on links to PD resources and online events Virtual Support Group through Parkinson's Association of [place]; next one is scheduled for Wednesday, October 5th at 2 pm. No September support group due to symposium.  (These are typically scheduled for the 1st Wednesday of the month at 2 pm).  Visit website for details.  Additional links for movement activities: Parkinson's DRUMMING Classes/Music Therapy with Jane Maydian:  This is a returning class!  2nd Mondays, beginning September 12th.  Contact Jane Maydian at [phone] or [email or web address] PWR! Moves Classes at [place] [facility] Exercise Room HAVE RESUMED!  Wednesdays 10 and 11 am.  Contact Amy Marriott, PT [email or web address] or [phone] if interested Here is a link to the PWR!Moves classes on Zoom from [place] Parkinson's Foundation - Daily Mon-Sat at 10:00. Via Zoom, FREE and open  to all.  There is also a link below via Facebook if you use that platform. https://www.parkinsonsmi.org/mpf-programs/exercise-and-movement-activities   https://www.facebook.com/ParkinsonsMI.org/posts/pwr-moves-exercise-class-parkinson-wellness-recovery-online-with-angee-ludwa-pt-/10156827878021813/ Parkinson's Wellness Recovery (PWR! Moves)  www.pwr4life.org Info on the PWR! Virtual Experience:  You will have access to our expertise through self-assessment, guided plans that start with the PD-specific fundamentals, educational content, tips, Q&A with an expert, and a growing library of PD-specific pre-recorded and live exercise classes of varying types and intensity - both physical and cognitive! If that is not enough, we offer 1:1 wellness consultations (in-person or virtual) to personalize your PWR! Virtual Experience.  Parkinson Foundation Fitness Fridays:  As part of the PD Health @ Home program, this free video series focuses each week on one aspect of fitness designed to support people living with Parkinson's.  These weekly videos highlight the Parkinson Foundation recent fitness guidelines for people with Parkinson's disease.  www.parkinson.org/understanding-parkinsons/coronavirus/PD-health-at-home/Fitness-Fridays Dance for PD website is offering free, live-stream classes throughout the week, as well as links to digital library of classes:  https://danceforparkinsons.org/ Dance for Parkinson's Class:  [employer] [place].  Free offering for people with Parkinson's and care partners; virtual class.  For more information, contact [phone] or email Magalli Morana at [email or web address] Virtual dance and Pilates for Parkinson's classes: Click on the Community Tab> Parkinson's Movement Initiative Tab.  To register for classes and for more information, visit www.americandancefestival.org and click the "community" tab.  [employer] Parkinson's Cycling Classes  [employer]: 1pm on Fridays-Live  classes at [employer] (Contact Margaret Hazen at [email or web address] or [phone]) [employer]: Virtual Classes Mondays and Thursdays /Live classes Tuesday, Wednesday and Thursday (contact Marlee at [email or web address]  or [phone])  [place] Steady Boxing Three levels of classes are offered Tuesdays and Thursdays:  10:30 am,  12 noon & 1:45 pm at [employer] [facility].  Active Stretching with Maria, New Class starting in March, on Fridays To observe a class or for  more information, call [phone] or email [email or web address] [employer]: Online Caregiver Education Opportunities:  www.well-springsolutions.org/caregiver-education/caregiver-support-group.  You may also contact Jodi Kolada at [email or web address] or [phone].   Powerful Tools for Caregivers:  6-week program beginning Thursday, October 13th.  This six-week educational series designed to provide family caregivers with practical tools to care for themselves while caring for a loved one Unlocking Dementia through Hope, Humor, and Understanding:  5-week series beginning September 7th Well[employer]Spring Navigator:  Just1Navigator program, a free service to help individuals and families through the journey of determining care for older adults.  The "Navigator" is a social worker, Nicole Reynolds, who will speak with a prospective client and/or loved ones to provide an assessment of the situation and a set of recommendations for a personalized care plan -- all free of charge, and whether [facility] offers the needed service or not. If the need is not a service we provide, we are well-connected with reputable programs in town that we can refer you to.  www.well-springsolutions.org or to speak with the Navigator, call [phone].  

## 2021-01-09 DIAGNOSIS — R531 Weakness: Secondary | ICD-10-CM | POA: Diagnosis not present

## 2021-01-09 DIAGNOSIS — R269 Unspecified abnormalities of gait and mobility: Secondary | ICD-10-CM | POA: Diagnosis not present

## 2021-01-09 DIAGNOSIS — R296 Repeated falls: Secondary | ICD-10-CM | POA: Diagnosis not present

## 2021-01-09 DIAGNOSIS — Z7409 Other reduced mobility: Secondary | ICD-10-CM | POA: Diagnosis not present

## 2021-01-10 DIAGNOSIS — Z6828 Body mass index (BMI) 28.0-28.9, adult: Secondary | ICD-10-CM | POA: Diagnosis not present

## 2021-01-10 DIAGNOSIS — N390 Urinary tract infection, site not specified: Secondary | ICD-10-CM | POA: Diagnosis not present

## 2021-01-17 DIAGNOSIS — W5501XA Bitten by cat, initial encounter: Secondary | ICD-10-CM | POA: Diagnosis not present

## 2021-01-17 DIAGNOSIS — L03114 Cellulitis of left upper limb: Secondary | ICD-10-CM | POA: Diagnosis not present

## 2021-01-17 DIAGNOSIS — Z0001 Encounter for general adult medical examination with abnormal findings: Secondary | ICD-10-CM | POA: Diagnosis not present

## 2021-01-17 DIAGNOSIS — Z1331 Encounter for screening for depression: Secondary | ICD-10-CM | POA: Diagnosis not present

## 2021-01-17 DIAGNOSIS — S41112A Laceration without foreign body of left upper arm, initial encounter: Secondary | ICD-10-CM | POA: Diagnosis not present

## 2021-01-18 DIAGNOSIS — Z7409 Other reduced mobility: Secondary | ICD-10-CM | POA: Diagnosis not present

## 2021-01-18 DIAGNOSIS — R296 Repeated falls: Secondary | ICD-10-CM | POA: Diagnosis not present

## 2021-01-18 DIAGNOSIS — R531 Weakness: Secondary | ICD-10-CM | POA: Diagnosis not present

## 2021-01-18 DIAGNOSIS — R269 Unspecified abnormalities of gait and mobility: Secondary | ICD-10-CM | POA: Diagnosis not present

## 2021-01-23 DIAGNOSIS — Z7409 Other reduced mobility: Secondary | ICD-10-CM | POA: Diagnosis not present

## 2021-01-23 DIAGNOSIS — R269 Unspecified abnormalities of gait and mobility: Secondary | ICD-10-CM | POA: Diagnosis not present

## 2021-01-23 DIAGNOSIS — R531 Weakness: Secondary | ICD-10-CM | POA: Diagnosis not present

## 2021-01-23 DIAGNOSIS — R296 Repeated falls: Secondary | ICD-10-CM | POA: Diagnosis not present

## 2021-01-25 DIAGNOSIS — R269 Unspecified abnormalities of gait and mobility: Secondary | ICD-10-CM | POA: Diagnosis not present

## 2021-01-25 DIAGNOSIS — Z7409 Other reduced mobility: Secondary | ICD-10-CM | POA: Diagnosis not present

## 2021-01-25 DIAGNOSIS — R296 Repeated falls: Secondary | ICD-10-CM | POA: Diagnosis not present

## 2021-01-25 DIAGNOSIS — R531 Weakness: Secondary | ICD-10-CM | POA: Diagnosis not present

## 2021-01-30 DIAGNOSIS — R269 Unspecified abnormalities of gait and mobility: Secondary | ICD-10-CM | POA: Diagnosis not present

## 2021-01-30 DIAGNOSIS — R296 Repeated falls: Secondary | ICD-10-CM | POA: Diagnosis not present

## 2021-01-30 DIAGNOSIS — Z7409 Other reduced mobility: Secondary | ICD-10-CM | POA: Diagnosis not present

## 2021-01-30 DIAGNOSIS — R531 Weakness: Secondary | ICD-10-CM | POA: Diagnosis not present

## 2021-02-01 DIAGNOSIS — R269 Unspecified abnormalities of gait and mobility: Secondary | ICD-10-CM | POA: Diagnosis not present

## 2021-02-01 DIAGNOSIS — R296 Repeated falls: Secondary | ICD-10-CM | POA: Diagnosis not present

## 2021-02-01 DIAGNOSIS — Z7409 Other reduced mobility: Secondary | ICD-10-CM | POA: Diagnosis not present

## 2021-02-01 DIAGNOSIS — R531 Weakness: Secondary | ICD-10-CM | POA: Diagnosis not present

## 2021-02-14 DIAGNOSIS — R296 Repeated falls: Secondary | ICD-10-CM | POA: Diagnosis not present

## 2021-02-14 DIAGNOSIS — Z7409 Other reduced mobility: Secondary | ICD-10-CM | POA: Diagnosis not present

## 2021-02-14 DIAGNOSIS — R531 Weakness: Secondary | ICD-10-CM | POA: Diagnosis not present

## 2021-02-14 DIAGNOSIS — R269 Unspecified abnormalities of gait and mobility: Secondary | ICD-10-CM | POA: Diagnosis not present

## 2021-02-20 DIAGNOSIS — R269 Unspecified abnormalities of gait and mobility: Secondary | ICD-10-CM | POA: Diagnosis not present

## 2021-02-20 DIAGNOSIS — Z7409 Other reduced mobility: Secondary | ICD-10-CM | POA: Diagnosis not present

## 2021-02-20 DIAGNOSIS — R296 Repeated falls: Secondary | ICD-10-CM | POA: Diagnosis not present

## 2021-02-20 DIAGNOSIS — R531 Weakness: Secondary | ICD-10-CM | POA: Diagnosis not present

## 2021-02-22 DIAGNOSIS — R269 Unspecified abnormalities of gait and mobility: Secondary | ICD-10-CM | POA: Diagnosis not present

## 2021-02-22 DIAGNOSIS — R296 Repeated falls: Secondary | ICD-10-CM | POA: Diagnosis not present

## 2021-02-22 DIAGNOSIS — R531 Weakness: Secondary | ICD-10-CM | POA: Diagnosis not present

## 2021-02-22 DIAGNOSIS — Z7409 Other reduced mobility: Secondary | ICD-10-CM | POA: Diagnosis not present

## 2021-02-27 DIAGNOSIS — Z7409 Other reduced mobility: Secondary | ICD-10-CM | POA: Diagnosis not present

## 2021-02-27 DIAGNOSIS — R296 Repeated falls: Secondary | ICD-10-CM | POA: Diagnosis not present

## 2021-02-27 DIAGNOSIS — R269 Unspecified abnormalities of gait and mobility: Secondary | ICD-10-CM | POA: Diagnosis not present

## 2021-02-27 DIAGNOSIS — R531 Weakness: Secondary | ICD-10-CM | POA: Diagnosis not present

## 2021-03-01 DIAGNOSIS — R531 Weakness: Secondary | ICD-10-CM | POA: Diagnosis not present

## 2021-03-01 DIAGNOSIS — R296 Repeated falls: Secondary | ICD-10-CM | POA: Diagnosis not present

## 2021-03-01 DIAGNOSIS — Z7409 Other reduced mobility: Secondary | ICD-10-CM | POA: Diagnosis not present

## 2021-03-01 DIAGNOSIS — R269 Unspecified abnormalities of gait and mobility: Secondary | ICD-10-CM | POA: Diagnosis not present

## 2021-03-06 DIAGNOSIS — Z7409 Other reduced mobility: Secondary | ICD-10-CM | POA: Diagnosis not present

## 2021-03-06 DIAGNOSIS — R269 Unspecified abnormalities of gait and mobility: Secondary | ICD-10-CM | POA: Diagnosis not present

## 2021-03-06 DIAGNOSIS — R296 Repeated falls: Secondary | ICD-10-CM | POA: Diagnosis not present

## 2021-03-06 DIAGNOSIS — R531 Weakness: Secondary | ICD-10-CM | POA: Diagnosis not present

## 2021-03-08 DIAGNOSIS — R269 Unspecified abnormalities of gait and mobility: Secondary | ICD-10-CM | POA: Diagnosis not present

## 2021-03-08 DIAGNOSIS — Z7409 Other reduced mobility: Secondary | ICD-10-CM | POA: Diagnosis not present

## 2021-03-08 DIAGNOSIS — R296 Repeated falls: Secondary | ICD-10-CM | POA: Diagnosis not present

## 2021-03-08 DIAGNOSIS — R531 Weakness: Secondary | ICD-10-CM | POA: Diagnosis not present

## 2021-03-12 DIAGNOSIS — R1310 Dysphagia, unspecified: Secondary | ICD-10-CM | POA: Diagnosis not present

## 2021-03-12 DIAGNOSIS — G20 Parkinson's disease: Secondary | ICD-10-CM | POA: Diagnosis not present

## 2021-03-12 DIAGNOSIS — Z7901 Long term (current) use of anticoagulants: Secondary | ICD-10-CM | POA: Diagnosis not present

## 2021-03-13 ENCOUNTER — Other Ambulatory Visit: Payer: Self-pay | Admitting: Neurology

## 2021-03-13 DIAGNOSIS — J0140 Acute pansinusitis, unspecified: Secondary | ICD-10-CM | POA: Diagnosis not present

## 2021-03-13 DIAGNOSIS — G20 Parkinson's disease: Secondary | ICD-10-CM

## 2021-03-13 DIAGNOSIS — G249 Dystonia, unspecified: Secondary | ICD-10-CM

## 2021-03-13 DIAGNOSIS — J029 Acute pharyngitis, unspecified: Secondary | ICD-10-CM | POA: Diagnosis not present

## 2021-03-15 DIAGNOSIS — R296 Repeated falls: Secondary | ICD-10-CM | POA: Diagnosis not present

## 2021-03-15 DIAGNOSIS — R269 Unspecified abnormalities of gait and mobility: Secondary | ICD-10-CM | POA: Diagnosis not present

## 2021-03-15 DIAGNOSIS — R531 Weakness: Secondary | ICD-10-CM | POA: Diagnosis not present

## 2021-03-15 DIAGNOSIS — Z7409 Other reduced mobility: Secondary | ICD-10-CM | POA: Diagnosis not present

## 2021-03-20 DIAGNOSIS — R269 Unspecified abnormalities of gait and mobility: Secondary | ICD-10-CM | POA: Diagnosis not present

## 2021-03-20 DIAGNOSIS — R296 Repeated falls: Secondary | ICD-10-CM | POA: Diagnosis not present

## 2021-03-20 DIAGNOSIS — Z7409 Other reduced mobility: Secondary | ICD-10-CM | POA: Diagnosis not present

## 2021-03-20 DIAGNOSIS — R531 Weakness: Secondary | ICD-10-CM | POA: Diagnosis not present

## 2021-03-21 DIAGNOSIS — J029 Acute pharyngitis, unspecified: Secondary | ICD-10-CM | POA: Diagnosis not present

## 2021-03-27 DIAGNOSIS — Z7409 Other reduced mobility: Secondary | ICD-10-CM | POA: Diagnosis not present

## 2021-03-27 DIAGNOSIS — R531 Weakness: Secondary | ICD-10-CM | POA: Diagnosis not present

## 2021-03-27 DIAGNOSIS — R269 Unspecified abnormalities of gait and mobility: Secondary | ICD-10-CM | POA: Diagnosis not present

## 2021-03-27 DIAGNOSIS — R296 Repeated falls: Secondary | ICD-10-CM | POA: Diagnosis not present

## 2021-03-29 DIAGNOSIS — R296 Repeated falls: Secondary | ICD-10-CM | POA: Diagnosis not present

## 2021-03-29 DIAGNOSIS — R531 Weakness: Secondary | ICD-10-CM | POA: Diagnosis not present

## 2021-03-29 DIAGNOSIS — Z7409 Other reduced mobility: Secondary | ICD-10-CM | POA: Diagnosis not present

## 2021-03-29 DIAGNOSIS — R269 Unspecified abnormalities of gait and mobility: Secondary | ICD-10-CM | POA: Diagnosis not present

## 2021-04-02 DIAGNOSIS — Z124 Encounter for screening for malignant neoplasm of cervix: Secondary | ICD-10-CM | POA: Diagnosis not present

## 2021-04-02 DIAGNOSIS — N958 Other specified menopausal and perimenopausal disorders: Secondary | ICD-10-CM | POA: Diagnosis not present

## 2021-04-02 DIAGNOSIS — R29890 Loss of height: Secondary | ICD-10-CM | POA: Diagnosis not present

## 2021-04-02 DIAGNOSIS — R300 Dysuria: Secondary | ICD-10-CM | POA: Diagnosis not present

## 2021-04-02 DIAGNOSIS — Z779 Other contact with and (suspected) exposures hazardous to health: Secondary | ICD-10-CM | POA: Diagnosis not present

## 2021-04-02 DIAGNOSIS — N390 Urinary tract infection, site not specified: Secondary | ICD-10-CM | POA: Diagnosis not present

## 2021-04-02 DIAGNOSIS — Z6830 Body mass index (BMI) 30.0-30.9, adult: Secondary | ICD-10-CM | POA: Diagnosis not present

## 2021-04-03 DIAGNOSIS — G20 Parkinson's disease: Secondary | ICD-10-CM | POA: Diagnosis not present

## 2021-04-03 DIAGNOSIS — K2950 Unspecified chronic gastritis without bleeding: Secondary | ICD-10-CM | POA: Diagnosis not present

## 2021-04-03 DIAGNOSIS — K2970 Gastritis, unspecified, without bleeding: Secondary | ICD-10-CM | POA: Diagnosis not present

## 2021-04-03 DIAGNOSIS — K449 Diaphragmatic hernia without obstruction or gangrene: Secondary | ICD-10-CM | POA: Diagnosis not present

## 2021-04-03 DIAGNOSIS — K219 Gastro-esophageal reflux disease without esophagitis: Secondary | ICD-10-CM | POA: Diagnosis not present

## 2021-04-03 DIAGNOSIS — R1310 Dysphagia, unspecified: Secondary | ICD-10-CM | POA: Diagnosis not present

## 2021-04-03 DIAGNOSIS — K222 Esophageal obstruction: Secondary | ICD-10-CM | POA: Diagnosis not present

## 2021-05-02 ENCOUNTER — Encounter: Payer: Self-pay | Admitting: Neurology

## 2021-05-03 ENCOUNTER — Other Ambulatory Visit (HOSPITAL_COMMUNITY): Payer: Self-pay

## 2021-05-03 ENCOUNTER — Other Ambulatory Visit: Payer: Self-pay

## 2021-05-03 DIAGNOSIS — R1310 Dysphagia, unspecified: Secondary | ICD-10-CM

## 2021-05-03 NOTE — Telephone Encounter (Signed)
Ordered the MBE and called patient to let her know she agreed

## 2021-05-08 ENCOUNTER — Ambulatory Visit (HOSPITAL_COMMUNITY)
Admission: RE | Admit: 2021-05-08 | Discharge: 2021-05-08 | Disposition: A | Discharge status: Home / Self Care | Payer: Medicare PPO | Source: Ambulatory Visit | Attending: Neurology | Admitting: Neurology

## 2021-05-08 ENCOUNTER — Telehealth: Payer: Self-pay | Admitting: Neurology

## 2021-05-08 ENCOUNTER — Other Ambulatory Visit: Payer: Self-pay

## 2021-05-08 DIAGNOSIS — R1310 Dysphagia, unspecified: Secondary | ICD-10-CM | POA: Diagnosis not present

## 2021-05-08 NOTE — Telephone Encounter (Signed)
Pt had mbe completed and therapist gave her the results but I wanted to make sure that she understood.  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  AP projection confirms centrality of its position. Solid foods lodged in diverticulum and they did not clear with f/u swallows, liquid wash, nor external palpation/pressure to neck.  Thin liquids briefly resided in space but cleared with subsequent dry swallows. Physiology of oropharyngeal swallow is normal with reliable and timely laryngeal vestibule closure (no penetration/aspiration).  Pt viewed the video in real time.  We discussed anatomy and recommendation that she call GI physician who recently completed EGD (unable to access EMR) to determine nature of f/u.  Make sure pt understands that this is a GI issue and not a Parkinsons Disease issue, as there is a structural problem there (diverticulum).  Needs GI follow up.  Fax results of MBE to GI if they aren't in our system

## 2021-05-08 NOTE — Progress Notes (Signed)
Objective Swallowing Evaluation: Type of Study: MBS-Modified Barium Swallow Study   Patient Details  Name: Barbara Miranda MRN: [id number] Date of Birth: 1950-11-01  Today's Date: 05/08/2021 Time: SLP Start Time (ACUTE ONLY): 1106 -SLP Stop Time (ACUTE ONLY): 1135  SLP Time Calculation (min) (ACUTE ONLY): 29 min   Past Medical History:  Past Medical History:  Diagnosis Date   Anxiety    Complication of anesthesia    "alot of times my BP will be low when I waking up" (01/19/2015)   Depression    DVT (deep venous thrombosis) (HCC)    GERD (gastroesophageal reflux disease)    Migraine aura, persistent    "a couple times/yr" (01/19/2015)   Parkinson's disease ([place])    Tremor    Vitamin D deficiency    Past Surgical History:  Past Surgical History:  Procedure Laterality Date   ANTERIOR CERVICAL DECOMP/DISCECTOMY FUSION  2006   C5-6   BACK SURGERY     CARDIAC CATHETERIZATION  ~ 12/2013   COMBINED HYSTEROSCOPY DIAGNOSTIC / D&C  03/2001   EP IMPLANTABLE DEVICE N/A 01/20/2015   Procedure: Loop Recorder Insertion;  Surgeon: Evans Lance, MD;  Location: [facility];  Service: Cardiovascular;  Laterality: N/A;   KNEE ARTHROSCOPY Right 1995   LAPAROSCOPIC CHOLECYSTECTOMY  ~ 2008   NASAL SEPTUM SURGERY  ~ 1985   ORIF ANKLE FRACTURE Left 01/21/2015   Procedure: OPEN REDUCTION INTERNAL FIXATION (ORIF) MEDIAL MALLEOLUS FRACTURE;  Surgeon: Altamese Roxton, MD;  Location: [facility];  Service: Orthopedics;  Laterality: Left;   TUBAL LIGATION  1988   HPI: Barbara Miranda is an energetic 18 y,o. female who was referred for OP MBS due to difficulty swallowing.  Pt has hx of Parkinson's disease (followed by Dr. Carles Collet). She is involved in OP therapy.  She reports frequent choking on and regurgitation of solid foods.  She had recent EGD with dilatation on 12/6 with no relief.  She has remote hx of ACDF in 2000.  Pt's voice is strong with clear phonation, no dysarthria.   Subjective: good  historian    Recommendations for follow up therapy are one component of a multi-disciplinary discharge planning process, led by the attending physician.  Recommendations may be updated based on patient status, additional functional criteria and insurance authorization.  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  AP projection confirms centrality of its position. Solid foods lodged in diverticulum and they did not clear with f/u swallows, liquid wash, nor external palpation/pressure to neck.  Thin liquids briefly resided in space but cleared with subsequent dry swallows. Physiology of oropharyngeal swallow is normal with reliable and timely laryngeal vestibule closure (no penetration/aspiration).    Pt viewed the video in real time.  We discussed anatomy and recommendation that she call GI physician who recently completed EGD (unable to access EMR) to determine nature of f/u.  Pt should avoid nuts/seeds/foods with hard edges and focus on soft moist solids and liquids, protein shakes.    SLP Visit Diagnosis Dysphagia, unspecified (R13.10)  Attention and concentration deficit following --  Frontal lobe and executive function deficit following --  Impact on safety and function --           Diet Recommendations 05/08/2021  SLP Diet Recommendations Thin liquid, soft/moist solids  Liquid Administration via Cup;Straw  Medication Administration Crushed with puree  Compensations --  Postural Changes --  Other Recommendations 05/08/2021  Recommended Consults Consider GI evaluation  Oral Care Recommendations Oral care BID  Other Recommendations --  Follow Up Recommendations No SLP follow up  Assistance recommended at discharge --  Functional Status Assessment --             Barbara Miranda, [place] CCC/SLP Acute Rehabilitation Services Office number  [phone] Pager [phone]    Barbara Miranda 05/08/2021, 12:42 PM

## 2021-05-08 NOTE — Telephone Encounter (Signed)
Called patient and she has seen Dr. Marcial Pacas Misenheimer  out of [facility] for the EGD and will call to schedule and appointment with him right away. I have faxed this report to his office  [phone]

## 2021-05-10 ENCOUNTER — Encounter: Payer: Self-pay | Admitting: Neurology

## 2021-05-11 ENCOUNTER — Encounter: Payer: Self-pay | Admitting: Neurology
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
## 2021-05-14 ENCOUNTER — Telehealth: Payer: Medicare PPO | Admitting: Neurology

## 2021-05-15 DIAGNOSIS — Z6830 Body mass index (BMI) 30.0-30.9, adult: Secondary | ICD-10-CM | POA: Diagnosis not present

## 2021-05-15 DIAGNOSIS — M25521 Pain in right elbow: Secondary | ICD-10-CM | POA: Diagnosis not present

## 2021-05-15 DIAGNOSIS — R1310 Dysphagia, unspecified: Secondary | ICD-10-CM | POA: Diagnosis not present

## 2021-05-17 ENCOUNTER — Other Ambulatory Visit: Payer: Self-pay | Admitting: Neurology

## 2021-06-03 ENCOUNTER — Other Ambulatory Visit: Payer: Self-pay | Admitting: Neurology

## 2021-06-03 DIAGNOSIS — G20B1 Parkinson's disease with dyskinesia, without mention of fluctuations: Secondary | ICD-10-CM

## 2021-06-03 DIAGNOSIS — G20A1 Parkinson's disease without dyskinesia, without mention of fluctuations: Secondary | ICD-10-CM

## 2021-06-03 DIAGNOSIS — G20 Parkinson's disease: Secondary | ICD-10-CM

## 2021-06-05 ENCOUNTER — Other Ambulatory Visit: Payer: Self-pay

## 2021-06-07 DIAGNOSIS — N3000 Acute cystitis without hematuria: Secondary | ICD-10-CM | POA: Diagnosis not present

## 2021-06-07 DIAGNOSIS — R300 Dysuria: Secondary | ICD-10-CM | POA: Diagnosis not present

## 2021-06-07 DIAGNOSIS — Z6830 Body mass index (BMI) 30.0-30.9, adult: Secondary | ICD-10-CM | POA: Diagnosis not present

## 2021-06-11 DIAGNOSIS — Z6830 Body mass index (BMI) 30.0-30.9, adult: Secondary | ICD-10-CM | POA: Diagnosis not present

## 2021-06-11 DIAGNOSIS — R21 Rash and other nonspecific skin eruption: Secondary | ICD-10-CM | POA: Diagnosis not present

## 2021-06-12 DIAGNOSIS — R1314 Dysphagia, pharyngoesophageal phase: Secondary | ICD-10-CM | POA: Diagnosis not present

## 2021-06-12 DIAGNOSIS — Z8739 Personal history of other diseases of the musculoskeletal system and connective tissue: Secondary | ICD-10-CM | POA: Diagnosis not present

## 2021-06-12 DIAGNOSIS — H6123 Impacted cerumen, bilateral: Secondary | ICD-10-CM | POA: Diagnosis not present

## 2021-06-21 DIAGNOSIS — Z6830 Body mass index (BMI) 30.0-30.9, adult: Secondary | ICD-10-CM | POA: Diagnosis not present

## 2021-06-21 DIAGNOSIS — N390 Urinary tract infection, site not specified: Secondary | ICD-10-CM | POA: Diagnosis not present

## 2021-06-21 DIAGNOSIS — R42 Dizziness and giddiness: Secondary | ICD-10-CM | POA: Diagnosis not present

## 2021-06-25 DIAGNOSIS — N950 Postmenopausal bleeding: Secondary | ICD-10-CM | POA: Diagnosis not present

## 2021-06-27 ENCOUNTER — Other Ambulatory Visit: Payer: Self-pay | Admitting: Otolaryngology

## 2021-06-27 DIAGNOSIS — H6123 Impacted cerumen, bilateral: Secondary | ICD-10-CM

## 2021-06-27 DIAGNOSIS — R1314 Dysphagia, pharyngoesophageal phase: Secondary | ICD-10-CM

## 2021-06-27 DIAGNOSIS — J3489 Other specified disorders of nose and nasal sinuses: Secondary | ICD-10-CM

## 2021-07-02 ENCOUNTER — Other Ambulatory Visit: Payer: Self-pay

## 2021-07-02 ENCOUNTER — Ambulatory Visit
Admission: RE | Admit: 2021-07-02 | Discharge: 2021-07-02 | Disposition: A | Discharge status: Home / Self Care | Payer: Medicare PPO | Source: Ambulatory Visit | Attending: Otolaryngology | Admitting: Otolaryngology

## 2021-07-02 DIAGNOSIS — R1314 Dysphagia, pharyngoesophageal phase: Secondary | ICD-10-CM

## 2021-07-02 DIAGNOSIS — R1310 Dysphagia, unspecified: Secondary | ICD-10-CM | POA: Diagnosis not present

## 2021-07-02 DIAGNOSIS — M810 Age-related osteoporosis without current pathological fracture: Secondary | ICD-10-CM | POA: Diagnosis not present

## 2021-07-02 DIAGNOSIS — H6123 Impacted cerumen, bilateral: Secondary | ICD-10-CM

## 2021-07-02 DIAGNOSIS — J3489 Other specified disorders of nose and nasal sinuses: Secondary | ICD-10-CM

## 2021-07-02 DIAGNOSIS — N950 Postmenopausal bleeding: Secondary | ICD-10-CM | POA: Diagnosis not present
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
## 2021-07-05 ENCOUNTER — Other Ambulatory Visit: Payer: Self-pay

## 2021-07-05 ENCOUNTER — Ambulatory Visit: Payer: Medicare PPO | Admitting: Neurology

## 2021-07-05 ENCOUNTER — Encounter: Payer: Self-pay | Admitting: Neurology

## 2021-07-05 VITALS — BP 123/62 | HR 72 | Ht 61.0 in | Wt 165.0 lb

## 2021-07-05 DIAGNOSIS — G249 Dystonia, unspecified: Secondary | ICD-10-CM | POA: Diagnosis not present

## 2021-07-05 DIAGNOSIS — G20 Parkinson's disease: Secondary | ICD-10-CM

## 2021-07-05 NOTE — Patient Instructions (Signed)
Local and Online Resources for Power over Parkinson's Group °March 2023 ° °LOCAL [place] PARKINSON'S GROUPS  °Power over Parkinson's Group :   °Power Over Parkinson's Patient Education Group will be Wednesday, March 8th-*Hybrid meting*- in person at [place] location and via WEBEX at 2:00 pm.   °Upcoming Power over Parkinson's Meetings:  2nd Wednesdays of the month at 2 pm:  March 8th, April 12th °Contact Amy Marriott at [email or web address] if interested in participating in this group °Parkinson's Care Partners Group:    3rd Mondays, Contact Misty Paladino °Atypical Parkinsonian Patient Group:   4th Wednesdays, Contact Misty Paladino °If you are interested in participating in these groups with Misty, please contact her directly for how to join those meetings.  Her contact information is [email or web address].   ° °LOCAL EVENTS AND NEW OFFERINGS °Parkinson's Wellness Event:  Pottery Night at [place].  Friday, March 10th 5:30-7:30 pm.  Sponsored by Parkinson's Movement Disorder Fund.  FREE event for people with Parkinson's and care partners.  RSVP to Misty at [email or web address] °Dine out at [employer].  Celebrate Parkinson's disease Awareness Month and Support the Parkinson's Movement Disorder Fund.   Wednesday, April 19th 4-6 pm at [employer], [address].  (Give receipt to cashier and 20% will be donated) °Parkinson's T-shirts for sale!  Designed by a local group member, with funds going to Movement Disorders Fund.  $20.00  Contact Misty to purchase (see email above) °New PWR! Moves Class offering at [employer]!  Fridays 1-2 pm in March (likely to switch days and times after March).  Come try it out and see if PWR! Moves is a good fit for your exercise routine!  Contact Amy Marriott for details:  [email or web address] °[facility] Challenge Bike, Run, Walk for PD, PSP, MSA.    Saturday, April 8th at 8 am.  [place], [place].   Proceeds go to offset costs of exercise programs locally.  To Register, visit website:  www.[email or web address] ° °ONLINE EDUCATION AND SUPPORT °Parkinson Foundation:  www.parkinson.org °PD Health at Home continues:  Mindfulness Mondays, Expert Briefing Tuesdays, Wellness Wednesdays, Take Time Thursdays, Fitness Fridays  °Upcoming Education:  °Parkinson's and Medications:  What's New.  Wednesday, March 8th at 1:00 pm. °Freezing and Fall Prevention in Parkinson's.  Wednesday, April 12th at 1:00 pm °Register for expert briefings (webinars) at https://www.[email or web address].org/resources-support/online-education/expert-briefings-webinars °[place] Parkinson's Symposium: Cognition Changes- a free in-person ([place], [place]) and online (Zoom) event for people with Parkinson's and their loved ones. During this event we will explore new treatments and practical strategies for addressing these changes.  Saturday, April 1st, 10 am-1 pm.  Register at [email or web address] or contact Jennifer De Gruccio at [phone] or [email or web address]. ° Please check out their website to sign up for emails and see their full online offerings ° °[employer] Foundation:  www.michaeljfox.org  °Third Thursday Webinars:  On the third Thursday of every month at 12 p.m. ET, join our free live webinars to learn about various aspects of living with Parkinson's disease and our work to speed medical breakthroughs. °Upcoming Webinar:  The Power of Women in Philanthropy.  Tues, March 28th at  12 pm. °Check out additional information on their website to see their full online offerings ° °[employer] Foundation:  www.davisphinneyfoundation.org °Upcoming Webinar:   Stay tuned °Webinar Series:  Living with Parkinson's Meetup.   Third Thursdays of each month at 3 pm °Care Partner Monthly Meetup.  With Connie Carpenter Phinney.  First Tuesday of each month, 2 pm °Check out additional information   to Live Well Today on their  website ° °Parkinson and Movement Disorders (PMD) Alliance:  www.pmdalliance.org °[employer]:  Online Education Events °Sign up for emails, which are sent weekly to give you updates on programming and online offerings ° °Parkinson's Association of [place]:  www.parkinsonassociation.org °Information on online support groups, education events, and online exercises including Yoga, Parkinson's exercises and more-LOTS of information on links to PD resources and online events °Virtual Support Group through Parkinson's Association of [place]; next one is scheduled for Wednesday, *April 5th at 2 pm. *March meeting has been cancelled. (These are typically scheduled for the 1st Wednesday of the month at 2 pm).  Visit website for details. °MOVEMENT AND EXERCISE OPPORTUNITIES °Parkinson's DRUMMING Classes/Music Therapy with Jane Maydian:  This is a returning class and it's FREE!  2nd Mondays, continuing March 13th , 11:00 at the [employer] [facility].  Contact *Misty Taylor-Paladino at [email or web address] or Jane Maydian at [phone] or [email or web address]  °PWR! Moves Classes at [facility].  Wednesdays 10 and 11 am.   NEW PWR! Moves Class offering at [employer].  Fridays 1-2 pm.  Contact Amy Marriott, PT [email or web address] if interested. °Here is a link to the PWR!Moves classes on Zoom from [place] Parkinson's Foundation - Daily Mon-Sat at 10:00. Via Zoom, FREE and open to all.  There is also a link below via Facebook if you use that platform. °https://www.parkinsonsmi.org/mpf-programs/exercise-and-movement-activities °https://www.facebook.com/ParkinsonsMI.org/posts/pwr-moves-exercise-class-parkinson-wellness-recovery-online-with-angee-ludwa-pt-/10156827878021813/ ° °Parkinson's Wellness Recovery (PWR! Moves)  www.pwr4life.org °Info on the PWR! Virtual Experience:  You will have access to our expertise through  self-assessment, guided plans that start with the PD-specific fundamentals, educational content, tips, Q&A with an expert, and a growing library of PD-specific pre-recorded and live exercise classes of varying types and intensity - both physical and cognitive! If that is not enough, we offer 1:1 wellness consultations (in-person or virtual) to personalize your PWR! Virtual Experience.  °Parkinson Foundation Fitness Fridays:  °As part of the PD Health @ Home program, this free video series focuses each week on one aspect of fitness designed to support people living with Parkinson's.  These weekly videos highlight the Parkinson Foundation recent fitness guidelines for people with Parkinson's disease. °www.parkinson.org/resources-support/online-education/pdhealth#ff °Dance for PD website is offering free, live-stream classes throughout the week, as well as links to digital library of classes:  https://[email or web address].org/ °Dance for Parkinson's in-person class.  February 1-April 26, Wednesdays 4-5 pm.  Free class for people with Parkinson's disease, at [address], [address], [place].  Contact [phone] or [email or web address] to register °Virtual dance and Pilates for Parkinson's classes: Click on the Community Tab> Parkinson's Movement Initiative Tab.  To register for classes and for more information, visit www.americandancefestival.org and click the “community” tab.  °[employer] Parkinson's Cycling Classes  °[facility]:  Thursdays @ Noon-Live classes at [facility] (Contact Margaret Hazen at [email or web address] or [phone]) °[facility]: Virtual Classes Mondays and Thursdays /Live classes Tuesday, Wednesday and Thursday (contact Marlee at [email or web address]  or [phone]) °[place] Steady Boxing °Varied levels of classes are offered Mondays, Tuesdays and Thursdays at [facility].  °Stretching with Maria weekly class is also offered for people with  Parkinson's °To observe a class or for more information, call [phone] or email Hillary Savage at [email or web address] °ADDITIONAL SUPPORT AND RESOURCES °[employer]:Online Caregiver Education Opportunities:  www.well-springsolutions.org/caregiver-education/caregiver-support-group.  You may also contact Jodi Kolada at [email or web address] or [phone].    °[employer] Navigator:  Just1Navigator program, a free service to help individuals and families through the journey   of determining care for older adults.  The “Navigator” is a social worker, Nicole Reynolds, who will speak with a prospective client and/or loved ones to provide an assessment of the situation and a set of recommendations for a personalized care plan -- all free of charge, and whether [employer] offers the needed service or not. If the need is not a service we provide, we are well-connected with reputable programs in town that we can refer you to.  www.well-springsolutions.org or to speak with the Navigator, call [phone] ° °

## 2021-07-23 DIAGNOSIS — Z20822 Contact with and (suspected) exposure to covid-19: Secondary | ICD-10-CM | POA: Diagnosis not present

## 2021-07-23 DIAGNOSIS — J028 Acute pharyngitis due to other specified organisms: Secondary | ICD-10-CM | POA: Diagnosis not present

## 2021-08-06 DIAGNOSIS — K2289 Other specified disease of esophagus: Secondary | ICD-10-CM | POA: Diagnosis not present

## 2021-08-06 DIAGNOSIS — L539 Erythematous condition, unspecified: Secondary | ICD-10-CM | POA: Diagnosis not present

## 2021-08-06 DIAGNOSIS — R1110 Vomiting, unspecified: Secondary | ICD-10-CM | POA: Diagnosis not present

## 2021-08-06 DIAGNOSIS — K225 Diverticulum of esophagus, acquired: Secondary | ICD-10-CM | POA: Diagnosis not present

## 2021-08-06 DIAGNOSIS — J384 Edema of larynx: Secondary | ICD-10-CM | POA: Diagnosis not present

## 2021-08-06 DIAGNOSIS — J383 Other diseases of vocal cords: Secondary | ICD-10-CM | POA: Diagnosis not present

## 2021-08-06 DIAGNOSIS — J387 Other diseases of larynx: Secondary | ICD-10-CM | POA: Diagnosis not present

## 2021-08-06 DIAGNOSIS — R1310 Dysphagia, unspecified: Secondary | ICD-10-CM | POA: Diagnosis not present

## 2021-08-13 DIAGNOSIS — R300 Dysuria: Secondary | ICD-10-CM | POA: Diagnosis not present

## 2021-08-13 DIAGNOSIS — Z6831 Body mass index (BMI) 31.0-31.9, adult: Secondary | ICD-10-CM | POA: Diagnosis not present

## 2021-08-29 ENCOUNTER — Other Ambulatory Visit: Payer: Self-pay | Admitting: Neurology

## 2021-08-29 DIAGNOSIS — Z961 Presence of intraocular lens: Secondary | ICD-10-CM | POA: Diagnosis not present

## 2021-08-29 DIAGNOSIS — G20 Parkinson's disease: Secondary | ICD-10-CM

## 2021-08-29 DIAGNOSIS — H04123 Dry eye syndrome of bilateral lacrimal glands: Secondary | ICD-10-CM | POA: Diagnosis not present

## 2021-08-29 DIAGNOSIS — H40013 Open angle with borderline findings, low risk, bilateral: Secondary | ICD-10-CM | POA: Diagnosis not present

## 2021-08-31 ENCOUNTER — Encounter: Payer: Self-pay | Admitting: Cardiology

## 2021-08-31 ENCOUNTER — Ambulatory Visit: Payer: Medicare PPO | Admitting: Cardiology

## 2021-08-31 VITALS — BP 142/92 | HR 67 | Ht 61.0 in | Wt 168.6 lb

## 2021-08-31 DIAGNOSIS — Z79899 Other long term (current) drug therapy: Secondary | ICD-10-CM

## 2021-08-31 DIAGNOSIS — I10 Essential (primary) hypertension: Secondary | ICD-10-CM

## 2021-08-31 DIAGNOSIS — G4733 Obstructive sleep apnea (adult) (pediatric): Secondary | ICD-10-CM | POA: Diagnosis not present

## 2021-08-31 DIAGNOSIS — R0609 Other forms of dyspnea: Secondary | ICD-10-CM | POA: Diagnosis not present

## 2021-08-31 LAB — BASIC METABOLIC PANEL
BUN/Creatinine Ratio: 16 (ref 12–28)
BUN: 13 mg/dL (ref 8–27)
CO2: 20 mmol/L (ref 20–29)
Calcium: 9.2 mg/dL (ref 8.7–10.3)
Chloride: 105 mmol/L (ref 96–106)
Creatinine, Ser: 0.82 mg/dL (ref 0.57–1.00)
Glucose: 106 mg/dL — ABNORMAL HIGH (ref 70–99)
Potassium: 3.8 mmol/L (ref 3.5–5.2)
Sodium: 138 mmol/L (ref 134–144)
eGFR: 77 mL/min/{1.73_m2} (ref >59)

## 2021-08-31 LAB — HEPATIC FUNCTION PANEL
ALT: 6 IU/L (ref 0–32)
AST: 16 IU/L (ref 0–40)
Albumin: 4.3 g/dL (ref 3.8–4.8)
Alkaline Phosphatase: 93 IU/L (ref 44–121)
Bilirubin Total: 0.3 mg/dL (ref 0.0–1.2)
Bilirubin, Direct: <0.1 mg/dL (ref 0.00–0.40)
Total Protein: 7 g/dL (ref 6.0–8.5)

## 2021-08-31 LAB — MAGNESIUM: Magnesium: 2.1 mg/dL (ref 1.6–2.3)

## 2021-08-31 NOTE — Progress Notes (Signed)
?Cardiology Office Note:   ? ?Date:  08/31/2021  ? ?ID:  Barbara Miranda, DOB February 20, 1951, MRN [id number] ? ?PCP:  Ernestene Kiel, MD  ?Cardiologist:  Berniece Salines, DO  ?Electrophysiologist:  None  ? ?Referring MD: Ernestene Kiel, MD  ? ?"I am doing well" ? ?History of Present Illness:   ? ?Barbara Miranda is a 71 y.o. female with a hx of  DVT with most recent recurrent clot next week ago (on Xarelto), history of syncope and is status post ILR implantation and removal and  per records she did not have any bradycardia or tachyarrhythmias. ? ?I saw the patient on 06/25/2018 at that time she reported that she was experiencing shortness of breath and chest pain.  Echocardiogram was ordered as well as given concern for chest pain and coronary CTA was also ordered. ?  ?Her last visit was May 13, 2019 at that time she was doing well from a cardiovascular standpoint. We discussed her testing results. ? ?At her last visit on June 27, 2020 she was doing well. ? ?Today she tells me that she has been doing well.  She tells me that her family thinks that she is short of breathing but no other complaints at this time. ? ?Past Medical History:  ?Diagnosis Date  ? Anxiety   ? Complication of anesthesia   ? "alot of times my BP will be low when I waking up" (01/19/2015)  ? Depression   ? DVT (deep venous thrombosis) ([place])   ? GERD (gastroesophageal reflux disease)   ? Migraine aura, persistent   ? "a couple times/yr" (01/19/2015)  ? Parkinson's disease ([place])   ? Tremor   ? Vitamin D deficiency   ? ? ?Past Surgical History:  ?Procedure Laterality Date  ? ANTERIOR CERVICAL DECOMP/DISCECTOMY FUSION  2006  ? C5-6  ? BACK SURGERY    ? CARDIAC CATHETERIZATION  ~ 12/2013  ? COMBINED HYSTEROSCOPY DIAGNOSTIC / D&C  03/2001  ? EP IMPLANTABLE DEVICE N/A 01/20/2015  ? Procedure: Loop Recorder Insertion;  Surgeon: Evans Lance, MD;  Location: [facility];  Service: Cardiovascular;  Laterality: N/A;  ? KNEE ARTHROSCOPY Right 1995   ? LAPAROSCOPIC CHOLECYSTECTOMY  ~ 2008  ? NASAL SEPTUM SURGERY  ~ 1985  ? ORIF ANKLE FRACTURE Left 01/21/2015  ? Procedure: OPEN REDUCTION INTERNAL FIXATION (ORIF) MEDIAL MALLEOLUS FRACTURE;  Surgeon: Altamese Pitts, MD;  Location: [facility];  Service: Orthopedics;  Laterality: Left;  ? TUBAL LIGATION  1988  ? ? ?Current Medications: ?Current Meds  ?Medication Sig  ? Ascorbic Acid (VITAMIN C) 1000 MG tablet Take 1,000 mg by mouth daily.  ? Biotin 1 MG CAPS Take 1 mg by mouth daily.   ? carbidopa-levodopa (SINEMET IR) 25-100 MG tablet Take 1.5 tablets at 7am/11am/3pm/7pm  ? cholecalciferol (VITAMIN D) 1000 UNITS tablet Take 2,000 Units by mouth daily.   ? FLUoxetine (PROZAC) 40 MG capsule Take 40 mg by mouth daily.   ? loratadine (CLARITIN) 10 MG tablet Take 10 mg by mouth daily.  ? Omega-3 Fatty Acids (FISH OIL) 1200 MG CAPS Take 1 capsule by mouth daily.  ? pantoprazole (PROTONIX) 40 MG tablet Take 40 mg by mouth daily.  ? Quercetin 250 MG TABS Take 500 mg by mouth daily.  ? topiramate (TOPAMAX) 50 MG tablet TAKE ONE TABLET BY MOUTH EVERY MORNING and TAKE ONE TABLET BY MOUTH DAILY AT 7 PM  ? XARELTO 20 MG TABS tablet Take 20 mg by mouth daily.  ?  zinc gluconate 50 MG tablet Take 50 mg by mouth daily.  ?  ? ?Allergies:   Mirapex [pramipexole dihydrochloride], Other, Pramipexole, Levofloxacin, Penicillins, Prednisone, and Propranolol  ? ?Social History  ? ?Socioeconomic History  ? Marital status: Divorced  ?  Spouse name: Not on file  ? Number of children: 3  ? Years of education: graduate classes  ? Highest education level: Not on file  ?Occupational History  ? Occupation: retired  ?Tobacco Use  ? Smoking status: Never  ? Smokeless tobacco: Never  ?Vaping Use  ? Vaping Use: Never used  ?Substance and Sexual Activity  ? Alcohol use: No  ?  Alcohol/week: 0.0 standard drinks  ? Drug use: No  ? Sexual activity: Never  ?Other Topics Concern  ? Not on file  ?Social History Narrative  ? left handed  ? One story home   ? Lives  alone   ? ?Social Determinants of Health  ? ?Financial Resource Strain: Not on file  ?Food Insecurity: Not on file  ?Transportation Needs: Not on file  ?Physical Activity: Not on file  ?Stress: Not on file  ?Social Connections: Not on file  ?  ? ?Family History: ?The patient's family history includes Breast cancer in her paternal aunt and sister; Cancer in her brother, father, and sister; Stroke in her mother. ? ?ROS:   ?Review of Systems  ?Constitution: Negative for decreased appetite, fever and weight gain.  ?HENT: Negative for congestion, ear discharge, hoarse voice and sore throat.   ?Eyes: Negative for discharge, redness, vision loss in right eye and visual halos.  ?Cardiovascular: Negative for chest pain, dyspnea on exertion, leg swelling, orthopnea and palpitations.  ?Respiratory: Negative for cough, hemoptysis, shortness of breath and snoring.   ?Endocrine: Negative for heat intolerance and polyphagia.  ?Hematologic/Lymphatic: Negative for bleeding problem. Does not bruise/bleed easily.  ?Skin: Negative for flushing, nail changes, rash and suspicious lesions.  ?Musculoskeletal: Negative for arthritis, joint pain, muscle cramps, myalgias, neck pain and stiffness.  ?Gastrointestinal: Negative for abdominal pain, bowel incontinence, diarrhea and excessive appetite.  ?Genitourinary: Negative for decreased libido, genital sores and incomplete emptying.  ?Neurological: Negative for brief paralysis, focal weakness, headaches and loss of balance.  ?Psychiatric/Behavioral: Negative for altered mental status, depression and suicidal ideas.  ?Allergic/Immunologic: Negative for HIV exposure and persistent infections.  ? ? ?EKGs/Labs/Other Studies Reviewed:   ? ?The following studies were reviewed today: ? ? ?EKG:  The ekg ordered today demonstrates sinus rhythm, heart rate 67 bpm low voltage ? ?Transthoracic echocardiogram done in November 2020 IMPRESSIONS  ? 1. Left ventricular ejection fraction, by visual estimation,  is 60 to  ?65%. The left ventricle has normal function. There is no left ventricular  ?hypertrophy.  ? 2. Left ventricular diastolic parameters are consistent with Grade I  ?diastolic dysfunction (impaired relaxation).  ? ?FINDINGS  ? Left Ventricle: Left ventricular ejection fraction, by visual estimation,  ?is 60 to 65%. The left ventricle has normal function. There is no left  ?ventricular hypertrophy. Left ventricular diastolic parameters are  ?consistent with Grade I diastolic  ?dysfunction (impaired relaxation). Normal left atrial pressure.  ? ?Right Ventricle: The right ventricular size is normal. No increase in right ventricular wall thickness. Global RV systolic function is has normal systolic function.  ? ?Left Atrium: Left atrial size was normal in size.  ? ?Right Atrium: Right atrial size was normal in size  ? ?Pericardium: There is no evidence of pericardial effusion.  ? ?Mitral Valve: The mitral valve is  normal in structure. No evidence of mitral valve stenosis by observation. No evidence of mitral valve regurgitation.  ? ?Tricuspid Valve: The tricuspid valve is normal in structure. Tricuspid valve regurgitation is trivial.  ? ?Aortic Valve: The aortic valve is normal in structure. Aortic valve regurgitation is not visualized. The aortic valve is structurally normal,  ?with no evidence of sclerosis or stenosis.  ? ?Pulmonic Valve: The pulmonic valve was normal in structure. Pulmonic valve  ?regurgitation is not visualized.  ? ?Aorta: The aortic root, ascending aorta and aortic arch are all structurally normal, with no evidence of dilitation or obstruction.  ?Venous: The inferior vena cava is normal in size with greater than 50%  ?respiratory variability, suggesting right atrial pressure of 3 mmHg.  ? ?IAS/Shunts: No atrial level shunt detected by color flow Doppler. No ventricular septal defect is seen or detected. There is no evidence of an atrial septal defect.  ?  ?Coronary CTA December 2020 ?CT  coronary ?FINDINGS: ?Non-cardiac: See separate report from [facility]. No ?significant findings on limited lung and soft tissue windows. ?  ?Calcium Score: One small isolated area of calcium in p

## 2021-08-31 NOTE — Patient Instructions (Signed)
Medication Instructions:  ?Your physician recommends that you continue on your current medications as directed. Please refer to the Current Medication list given to you today.  ?Please take your blood pressure daily for 2 weeks and send in a MyChart message. Please include heart rates.  ? ?HOW TO TAKE YOUR BLOOD PRESSURE: ?Rest 5 minutes before taking your blood pressure. ?Don?t smoke or drink caffeinated beverages for at least 30 minutes before. ?Take your blood pressure before (not after) you eat. ?Sit comfortably with your back supported and both feet on the floor (don?t cross your legs). ?Elevate your arm to heart level on a table or a desk. ?Use the proper sized cuff. It should fit smoothly and snugly around your bare upper arm. There should be enough room to slip a fingertip under the cuff. The bottom edge of the cuff should be 1 inch above the crease of the elbow. ?Ideally, take 3 measurements at one sitting and record the average. ? ?*If you need a refill on your cardiac medications before your next appointment, please call your pharmacy* ? ? ?Lab Work: ?Your physician recommends that you return for lab work in:  ?TODAY: BMET, Mag, LFTs ?If you have labs (blood work) drawn today and your tests are completely normal, you will receive your results only by: ?MyChart Message (if you have MyChart) OR ?A paper copy in the mail ?If you have any lab test that is abnormal or we need to change your treatment, we will call you to review the results. ? ? ?Testing/Procedures: ?None ? ? ?Follow-Up: ?At [facility], you and your health needs are our priority.  As part of our continuing mission to provide you with exceptional heart care, we have created designated Provider Care Teams.  These Care Teams include your primary Cardiologist (physician) and Advanced Practice Providers (APPs -  Physician Assistants and Nurse Practitioners) who all work together to provide you with the care you need, when you need it. ? ?We  recommend signing up for the patient portal called "MyChart".  Sign up information is provided on this After Visit Summary.  MyChart is used to connect with patients for Virtual Visits (Telemedicine).  Patients are able to view lab/test results, encounter notes, upcoming appointments, etc.  Non-urgent messages can be sent to your provider as well.   ?To learn more about what you can do with MyChart, go to [email or web address].   ? ?Your next appointment:   ?1 year(s) ? ?The format for your next appointment:   ?In Person ? ?Provider:   ?Berniece Salines, DO   ? ? ?Other Instructions ? ? ?Important Information About Sugar ? ? ? ? ?  ?

## 2021-09-17 DIAGNOSIS — R110 Nausea: Secondary | ICD-10-CM | POA: Diagnosis not present

## 2021-09-17 DIAGNOSIS — N3000 Acute cystitis without hematuria: Secondary | ICD-10-CM | POA: Diagnosis not present

## 2021-09-17 DIAGNOSIS — R300 Dysuria: Secondary | ICD-10-CM | POA: Diagnosis not present

## 2021-10-02 DIAGNOSIS — N952 Postmenopausal atrophic vaginitis: Secondary | ICD-10-CM | POA: Diagnosis not present

## 2021-10-02 DIAGNOSIS — N390 Urinary tract infection, site not specified: Secondary | ICD-10-CM | POA: Diagnosis not present

## 2021-10-02 DIAGNOSIS — Z79899 Other long term (current) drug therapy: Secondary | ICD-10-CM | POA: Diagnosis not present

## 2021-10-02 DIAGNOSIS — N3941 Urge incontinence: Secondary | ICD-10-CM | POA: Diagnosis not present

## 2021-10-03 DIAGNOSIS — Q396 Congenital diverticulum of esophagus: Secondary | ICD-10-CM | POA: Diagnosis not present

## 2021-10-03 DIAGNOSIS — K225 Diverticulum of esophagus, acquired: Secondary | ICD-10-CM | POA: Diagnosis not present

## 2021-10-03 DIAGNOSIS — R1310 Dysphagia, unspecified: Secondary | ICD-10-CM | POA: Diagnosis not present

## 2021-10-05 DIAGNOSIS — Z1231 Encounter for screening mammogram for malignant neoplasm of breast: Secondary | ICD-10-CM | POA: Diagnosis not present

## 2021-10-08 ENCOUNTER — Encounter: Payer: Self-pay | Admitting: Neurology

## 2021-10-08 NOTE — Telephone Encounter (Signed)
Talked to patient and gave her Dr. Iona Beard recommendations

## 2021-10-08 NOTE — Telephone Encounter (Signed)
Called and left patient voicemail message.

## 2021-10-15 DIAGNOSIS — D692 Other nonthrombocytopenic purpura: Secondary | ICD-10-CM | POA: Diagnosis not present

## 2021-10-15 DIAGNOSIS — Z79899 Other long term (current) drug therapy: Secondary | ICD-10-CM | POA: Diagnosis not present

## 2021-10-15 DIAGNOSIS — Z6832 Body mass index (BMI) 32.0-32.9, adult: Secondary | ICD-10-CM | POA: Diagnosis not present

## 2021-10-15 DIAGNOSIS — H04123 Dry eye syndrome of bilateral lacrimal glands: Secondary | ICD-10-CM | POA: Diagnosis not present

## 2021-10-22 DIAGNOSIS — R739 Hyperglycemia, unspecified: Secondary | ICD-10-CM | POA: Diagnosis not present

## 2021-10-29 ENCOUNTER — Telehealth: Payer: Self-pay | Admitting: Neurology

## 2021-10-29 NOTE — Telephone Encounter (Signed)
Patient has been having issues swallowing for a few months. She has had all types of test done. She is on topirimate, would like to try a lower dose to see if that'll help her

## 2021-10-29 NOTE — Telephone Encounter (Signed)
Patient advised of Dr.Tat note:  Its not causing swallow issues but she can back down to 1 daily for a week and then stop it.

## 2021-11-05 DIAGNOSIS — R1314 Dysphagia, pharyngoesophageal phase: Secondary | ICD-10-CM | POA: Diagnosis not present

## 2021-11-05 DIAGNOSIS — Q396 Congenital diverticulum of esophagus: Secondary | ICD-10-CM | POA: Diagnosis not present

## 2021-11-13 ENCOUNTER — Other Ambulatory Visit: Payer: Self-pay | Admitting: Neurology

## 2021-11-15 DIAGNOSIS — Z6832 Body mass index (BMI) 32.0-32.9, adult: Secondary | ICD-10-CM | POA: Diagnosis not present

## 2021-11-15 DIAGNOSIS — R42 Dizziness and giddiness: Secondary | ICD-10-CM | POA: Diagnosis not present

## 2021-11-19 ENCOUNTER — Telehealth: Payer: Self-pay | Admitting: Gastroenterology

## 2021-11-19 NOTE — Telephone Encounter (Signed)
Good Afternoon Dr Chales Abrahams,    We received a referral for Dysphasia. Patient last seen by Dr Jennye Boroughs at [facility] in 2019. Patient is requesting to continue her care with you because a lot of her family members are your patients.   Records have been obtained for you to review and advise on scheduling.  Thank You.

## 2021-12-06 NOTE — Telephone Encounter (Signed)
Patient made aware she needs to continue to see [facility]. No transfer of care. Patient said she will continue with them

## 2021-12-11 DIAGNOSIS — N390 Urinary tract infection, site not specified: Secondary | ICD-10-CM | POA: Diagnosis not present

## 2021-12-11 DIAGNOSIS — N3941 Urge incontinence: Secondary | ICD-10-CM | POA: Diagnosis not present

## 2021-12-11 DIAGNOSIS — N952 Postmenopausal atrophic vaginitis: Secondary | ICD-10-CM | POA: Diagnosis not present
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
## 2022-01-07 ENCOUNTER — Other Ambulatory Visit: Payer: Self-pay

## 2022-01-07 ENCOUNTER — Encounter: Payer: Self-pay | Admitting: Neurology

## 2022-01-07 ENCOUNTER — Ambulatory Visit: Payer: Medicare PPO | Admitting: Neurology

## 2022-01-07 VITALS — BP 115/75 | HR 67 | Ht 61.0 in | Wt 168.8 lb

## 2022-01-07 DIAGNOSIS — G20 Parkinson's disease: Secondary | ICD-10-CM

## 2022-01-07 MED ORDER — RYTARY 36.25-145 MG PO CPCR: ORAL_CAPSULE | ORAL | 0 refills | Status: DC

## 2022-01-07 NOTE — Patient Instructions (Signed)
Take Rytary 145 mg, 1 capsule 4 times per day.  Call me 2 weeks before hand and did it help, but also did it help the nausea.  Local and Online Resources for Power over Parkinson's Group September 2023  LOCAL [place] PARKINSON'S GROUPS  Power over Parkinson's Group:   Power Over Parkinson's Patient Education Group will be Wednesday, September 13th-*Hybrid meting*- in person at [facility] location and via [facility] at 2 pm.   Upcoming Power over [employer] Meetings:  2nd Wednesdays of the month at 2 pm:  September 13th, October 11th, November 8th Contact Amy Marriott at [email or web address] if interested in participating in this group Parkinson's Care Partners Group:    3rd Mondays, Contact Misty Paladino Atypical Parkinsonian Patient Group:   4th Wednesdays, Contact Misty Paladino If you are interested in participating in these groups with Misty, please contact her directly for how to join those meetings.  Her contact information is [email or web address].    LOCAL EVENTS AND NEW OFFERINGS New PWR! Moves [employer] Instructor-Led Classes offering at [employer]!  Wednesdays 1-2 pm.   Contact Synetta Shadow at  [email or web address] or Aldona Lento at [place], [email or web address] Dance for Parkinson 's classes will be on Tuesdays 9:30am-10:30am starting October 3-December 12 with a break the week of November 21 . Located in the November 23 which is in the first floor of the [employer] ([address]  Drumming for Parkinson's will be held on 2nd and 4th Mondays at 11:00am . First class will start  September 25th.  Located at the [place] [facility] [place] ([address]. [place] Through support from the [place], the Dance and Drumming for Parkinson's classes are free for both patients and caregivers.  Contact Misty Taylor-Paladino for more details about  registering.  ONLINE EDUCATION AND SUPPORT Parkinson Foundation:  www.parkinson.org PD Health at Home continues:  Mindfulness Mondays, Wellness Wednesdays, Fitness Fridays  Upcoming Education:   Navigating Nutrition with PD.  Wednesday, Sept. 6th 1:00-2:00 pm Understanding Mind and Memory.  Wednesday, Sept. 20th 1:00-2:00 pm  Expert Briefing:    Parkinson's Disease and the Bladder.  Wednesday, Sept. 13th 1:00-2:00 pm Parkinson's and the Gut-Brain Connection.  Wednesday, Oct. 11th 1:00-2:00 pm Register for expert briefings (webinars) at 10-01-1989 Please check out their website to sign up for emails and see their full online offerings   [employer] Foundation:  www.[email or web address]  Third Thursday Webinars:  On the third Thursday of every month at 12 p.m. ET, join our free live webinars to learn about various aspects of living with Parkinson's disease and our work to speed medical breakthroughs. Upcoming Webinar:  Stay tuned Check out additional information on their website to see their full online offerings  Saturday:  [email or web address] Upcoming Webinar:   Stay tuned Webinar Series:  Living with Parkinson's Meetup.   Third Thursdays each month, 3 pm Care Partner Monthly Meetup.  With 09-29-1978 Phinney.  First Tuesday of each month, 2 pm Check out additional information to Live Well Today on their website  Parkinson and Movement Disorders (PMD) Alliance:  www.pmdalliance.org NeuroLife Online:  Online Education Events Sign up for emails, which are sent weekly to give you updates on programming and online offerings  Parkinson's Association of the [place]:  www.parkinsonassociation.org Information on online support groups, education events, and online exercises including Yoga, Parkinson's exercises and more-LOTS of information on links to PD resources and online  events Virtual Support Group through Parkinson's Association of  the [facility]; next one is scheduled for Wednesday, October 4th at 2 pm. (No September meeting due to the symposium.  These are typically scheduled for the 1st Wednesday of the month at 2 pm).  Visit website for details. Register for "Caring for Parkinson's-Caring for You", 9th Annual Symposium.  In-person event in [place].  September 9th.  To register:  www.[email or web address].org/symposium-registration/?blm_aid=[email or web address] MOVEMENT AND EXERCISE OPPORTUNITIES PWR! Moves Classes at [facility] Exercise Room.  Wednesdays 10 and 11 am.   Contact Amy Marriott, PT [email or web address] if interested. NEW PWR! Moves Class offerings at [employer].  Wednesdays 1-2 pm.  Contact Synetta Shadow at  [email or web address] or Aldona Lento at [place],  [email or web address] Parkinson's Wellness Recovery (PWR! Moves)  www.pwr4life.org Info on the PWR! Virtual Experience:  You will have access to our expertise through self-assessment, guided plans that start with the PD-specific fundamentals, educational content, tips, Q&A with an expert, and a growing [employer] of PD-specific pre-recorded and live exercise classes of varying types and intensity - both physical and cognitive! If that is not enough, we offer 1:1 wellness consultations (in-person or virtual) to personalize your PWR! Engineering geologist.  Parkinson Dance movement psychotherapist Fridays:  As part of the PD Health @ Home program, this free video series focuses each week on one aspect of fitness designed to support people living with Parkinson's.  These weekly videos highlight the Parkinson Foundation recent fitness guidelines for people with Parkinson's disease. 09-16-1988 Dance for PD website is offering free, live-stream classes throughout the week, as well as links to [email or web address].br of classes:   [email or web address]://danceforparkinsons.[email or web address]/ Virtual dance and Pilates for Parkinson's classes: Click on the Community Tab> Parkinson's Movement Initiative Tab.  To register for classes and for more information, visit www.[employer] and click the "community" tab.  [employer] Parkinson's Cycling Classes  [employer]:  Thursdays @ Noon-Live classes at 09-29-1978 ([employer] at [email or web address] or [phone]) [employer]: Virtual Classes Mondays and Thursdays 09-29-1978 classes Tuesday, Wednesday and Thursday (contact [place] at [email or web address]  or [phone]) [facility] Boxing Varied levels of classes are offered Tuesdays and Thursdays at 09-29-1978.  Stretching with [employer] weekly class is also offered for people with Parkinson's To observe a class or for more information, call [phone] or email [phone] at [email or web address] ADDITIONAL SUPPORT AND RESOURCES Well-Spring Solutions:Online Caregiver Education Opportunities:  www.well-springsolutions.org/caregiver-education/caregiver-support-group.  You may also contact Patricia Nettle at [email or web address] or [phone].    Coping with Difficult Caregiver Emotions.  Wednesday, September 20th, 10:30 am-12.  [facility], [facility] Navigating the [place]aze of Senior Care Options.  Thursday, September 28th, 4-5:15 pm.  [facility]. Well-Spring Navigator:  04-[birth year]-2001 program, a free service to help individuals and families through the journey of determining care for older adults.  The "Navigator" is a [facility], [employer], who will speak with a prospective client and/or loved ones to provide an assessment of the situation and a set of recommendations for a personalized care plan -- all free of charge, and whether Well-Spring Solutions offers the needed service or not. If the need is not a service we provide, we are well-connected with  reputable programs in town that we can refer you to.  www.well-springsolutions.org or to speak with the Navigator, call [phone].

## 2022-01-09 DIAGNOSIS — M810 Age-related osteoporosis without current pathological fracture: Secondary | ICD-10-CM | POA: Diagnosis not present

## 2022-01-10 DIAGNOSIS — I700 Atherosclerosis of aorta: Secondary | ICD-10-CM | POA: Diagnosis not present

## 2022-01-10 DIAGNOSIS — K219 Gastro-esophageal reflux disease without esophagitis: Secondary | ICD-10-CM | POA: Diagnosis not present

## 2022-01-10 DIAGNOSIS — Z86718 Personal history of other venous thrombosis and embolism: Secondary | ICD-10-CM | POA: Diagnosis not present

## 2022-01-10 DIAGNOSIS — F419 Anxiety disorder, unspecified: Secondary | ICD-10-CM | POA: Diagnosis not present

## 2022-01-10 DIAGNOSIS — G20 Parkinson's disease: Secondary | ICD-10-CM | POA: Diagnosis not present

## 2022-01-14 ENCOUNTER — Encounter: Payer: Self-pay | Admitting: Neurology

## 2022-01-28 MED ORDER — CARBIDOPA-LEVODOPA 25-100 MG PO TABS: 1.5000 | ORAL_TABLET | Freq: Four times a day (QID) | ORAL | 1 refills | Status: DC

## 2022-01-29 DIAGNOSIS — Q396 Congenital diverticulum of esophagus: Secondary | ICD-10-CM | POA: Diagnosis not present

## 2022-01-31 ENCOUNTER — Other Ambulatory Visit: Payer: Self-pay | Admitting: Neurology

## 2022-01-31 DIAGNOSIS — M810 Age-related osteoporosis without current pathological fracture: Secondary | ICD-10-CM | POA: Diagnosis not present

## 2022-02-05 ENCOUNTER — Other Ambulatory Visit: Payer: Self-pay | Admitting: Neurology

## 2022-02-07 DIAGNOSIS — R233 Spontaneous ecchymoses: Secondary | ICD-10-CM | POA: Diagnosis not present

## 2022-02-07 DIAGNOSIS — L570 Actinic keratosis: Secondary | ICD-10-CM | POA: Diagnosis not present

## 2022-02-12 DIAGNOSIS — R350 Frequency of micturition: Secondary | ICD-10-CM | POA: Diagnosis not present

## 2022-02-28 ENCOUNTER — Encounter: Payer: Self-pay | Admitting: Neurology

## 2022-03-05 DIAGNOSIS — Z961 Presence of intraocular lens: Secondary | ICD-10-CM | POA: Diagnosis not present

## 2022-03-05 DIAGNOSIS — H40013 Open angle with borderline findings, low risk, bilateral: Secondary | ICD-10-CM | POA: Diagnosis not present

## 2022-03-05 DIAGNOSIS — H04123 Dry eye syndrome of bilateral lacrimal glands: Secondary | ICD-10-CM | POA: Diagnosis not present

## 2022-03-14 DIAGNOSIS — G4730 Sleep apnea, unspecified: Secondary | ICD-10-CM | POA: Diagnosis not present

## 2022-03-14 DIAGNOSIS — M791 Myalgia, unspecified site: Secondary | ICD-10-CM | POA: Diagnosis not present

## 2022-03-14 DIAGNOSIS — N390 Urinary tract infection, site not specified: Secondary | ICD-10-CM | POA: Diagnosis not present

## 2022-03-14 DIAGNOSIS — G20A1 Parkinson's disease without dyskinesia, without mention of fluctuations: Secondary | ICD-10-CM | POA: Diagnosis not present

## 2022-04-03 DIAGNOSIS — N390 Urinary tract infection, site not specified: Secondary | ICD-10-CM | POA: Diagnosis not present

## 2022-04-03 DIAGNOSIS — N3941 Urge incontinence: Secondary | ICD-10-CM | POA: Diagnosis not present

## 2022-04-03 DIAGNOSIS — N952 Postmenopausal atrophic vaginitis: Secondary | ICD-10-CM | POA: Diagnosis not present

## 2022-04-04 DIAGNOSIS — Z01419 Encounter for gynecological examination (general) (routine) without abnormal findings: Secondary | ICD-10-CM | POA: Diagnosis not present

## 2022-04-04 DIAGNOSIS — Z6832 Body mass index (BMI) 32.0-32.9, adult: Secondary | ICD-10-CM | POA: Diagnosis not present

## 2022-04-04 DIAGNOSIS — R7309 Other abnormal glucose: Secondary | ICD-10-CM | POA: Diagnosis not present

## 2022-04-04 DIAGNOSIS — M79601 Pain in right arm: Secondary | ICD-10-CM | POA: Diagnosis not present

## 2022-04-14 ENCOUNTER — Encounter: Payer: Self-pay | Admitting: Neurology

## 2022-04-30 DIAGNOSIS — G4733 Obstructive sleep apnea (adult) (pediatric): Secondary | ICD-10-CM | POA: Diagnosis not present

## 2022-05-23 DIAGNOSIS — M5030 Other cervical disc degeneration, unspecified cervical region: Secondary | ICD-10-CM | POA: Diagnosis not present

## 2022-05-23 DIAGNOSIS — M79601 Pain in right arm: Secondary | ICD-10-CM | POA: Diagnosis not present

## 2022-05-23 DIAGNOSIS — M79641 Pain in right hand: Secondary | ICD-10-CM | POA: Diagnosis not present

## 2022-05-24 DIAGNOSIS — M419 Scoliosis, unspecified: Secondary | ICD-10-CM | POA: Diagnosis not present

## 2022-05-24 DIAGNOSIS — M47812 Spondylosis without myelopathy or radiculopathy, cervical region: Secondary | ICD-10-CM | POA: Diagnosis not present

## 2022-05-24 DIAGNOSIS — M2578 Osteophyte, vertebrae: Secondary | ICD-10-CM | POA: Diagnosis not present

## 2022-05-24 DIAGNOSIS — M79641 Pain in right hand: Secondary | ICD-10-CM | POA: Diagnosis not present

## 2022-05-24 DIAGNOSIS — M5030 Other cervical disc degeneration, unspecified cervical region: Secondary | ICD-10-CM | POA: Diagnosis not present

## 2022-05-24 DIAGNOSIS — M79601 Pain in right arm: Secondary | ICD-10-CM | POA: Diagnosis not present

## 2022-05-24 DIAGNOSIS — Z981 Arthrodesis status: Secondary | ICD-10-CM | POA: Diagnosis not present

## 2022-05-30 DIAGNOSIS — M47812 Spondylosis without myelopathy or radiculopathy, cervical region: Secondary | ICD-10-CM | POA: Diagnosis not present

## 2022-05-30 DIAGNOSIS — Z6831 Body mass index (BMI) 31.0-31.9, adult: Secondary | ICD-10-CM | POA: Diagnosis not present

## 2022-05-30 DIAGNOSIS — M19041 Primary osteoarthritis, right hand: Secondary | ICD-10-CM | POA: Diagnosis not present

## 2022-06-20 DIAGNOSIS — M79621 Pain in right upper arm: Secondary | ICD-10-CM | POA: Diagnosis not present

## 2022-06-20 DIAGNOSIS — M542 Cervicalgia: Secondary | ICD-10-CM | POA: Diagnosis not present

## 2022-06-25 DIAGNOSIS — M542 Cervicalgia: Secondary | ICD-10-CM | POA: Diagnosis not present

## 2022-06-25 DIAGNOSIS — M79621 Pain in right upper arm: Secondary | ICD-10-CM | POA: Diagnosis not present

## 2022-06-27 DIAGNOSIS — M542 Cervicalgia: Secondary | ICD-10-CM | POA: Diagnosis not present

## 2022-06-27 DIAGNOSIS — M79621 Pain in right upper arm: Secondary | ICD-10-CM | POA: Diagnosis not present

## 2022-07-02 DIAGNOSIS — M542 Cervicalgia: Secondary | ICD-10-CM | POA: Diagnosis not present

## 2022-07-02 DIAGNOSIS — M79621 Pain in right upper arm: Secondary | ICD-10-CM | POA: Diagnosis not present

## 2022-07-04 DIAGNOSIS — M542 Cervicalgia: Secondary | ICD-10-CM | POA: Diagnosis not present

## 2022-07-04 DIAGNOSIS — M79621 Pain in right upper arm: Secondary | ICD-10-CM | POA: Diagnosis not present

## 2022-07-09 DIAGNOSIS — M79621 Pain in right upper arm: Secondary | ICD-10-CM | POA: Diagnosis not present

## 2022-07-09 DIAGNOSIS — M542 Cervicalgia: Secondary | ICD-10-CM | POA: Diagnosis not present

## 2022-07-10 DIAGNOSIS — R12 Heartburn: Secondary | ICD-10-CM | POA: Diagnosis not present

## 2022-07-10 DIAGNOSIS — K5900 Constipation, unspecified: Secondary | ICD-10-CM | POA: Diagnosis not present

## 2022-07-10 DIAGNOSIS — Z6831 Body mass index (BMI) 31.0-31.9, adult: Secondary | ICD-10-CM | POA: Diagnosis not present

## 2022-07-10 DIAGNOSIS — F419 Anxiety disorder, unspecified: Secondary | ICD-10-CM | POA: Diagnosis not present

## 2022-07-10 DIAGNOSIS — G20A1 Parkinson's disease without dyskinesia, without mention of fluctuations: Secondary | ICD-10-CM | POA: Diagnosis not present

## 2022-07-11 DIAGNOSIS — M79621 Pain in right upper arm: Secondary | ICD-10-CM | POA: Diagnosis not present

## 2022-07-11 DIAGNOSIS — M542 Cervicalgia: Secondary | ICD-10-CM | POA: Diagnosis not present
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
## 2022-07-15 DIAGNOSIS — Z6830 Body mass index (BMI) 30.0-30.9, adult: Secondary | ICD-10-CM | POA: Diagnosis not present

## 2022-07-15 DIAGNOSIS — N390 Urinary tract infection, site not specified: Secondary | ICD-10-CM | POA: Diagnosis not present

## 2022-07-16 ENCOUNTER — Ambulatory Visit: Payer: Medicare PPO | Admitting: Neurology

## 2022-07-16 ENCOUNTER — Encounter: Payer: Self-pay | Admitting: Neurology

## 2022-07-16 VITALS — BP 116/74 | HR 61 | Ht 61.5 in | Wt 156.4 lb

## 2022-07-16 DIAGNOSIS — N390 Urinary tract infection, site not specified: Secondary | ICD-10-CM | POA: Diagnosis not present

## 2022-07-16 DIAGNOSIS — M542 Cervicalgia: Secondary | ICD-10-CM | POA: Diagnosis not present

## 2022-07-16 DIAGNOSIS — G20B2 Parkinson's disease with dyskinesia, with fluctuations: Secondary | ICD-10-CM | POA: Diagnosis not present

## 2022-07-16 DIAGNOSIS — M79621 Pain in right upper arm: Secondary | ICD-10-CM | POA: Diagnosis not present

## 2022-07-16 MED ORDER — CARBIDOPA-LEVODOPA 25-100 MG PO TABS: 2.0000 | ORAL_TABLET | Freq: Four times a day (QID) | ORAL | 1 refills | Status: DC

## 2022-07-16 NOTE — Patient Instructions (Signed)
Local and Online Resources for Power over Parkinson's Group  March 2024   LOCAL [place] PARKINSON'S GROUPS   Power over Parkinson's Group:    Power Over Parkinson's Patient Education Group will be Wednesday, March 13th-*Hybrid meting*- in person at [place] location and via [employer], 2:00-3:00 pm.   Starting in November 2023, Power over Parkinson's and Care Partner Groups will meet together, with plans for separate break out session for caregivers (*this will be evolving over the next few months) Upcoming Power over Parkinson's Meetings/Care Partner Support:  2nd Wednesdays of the month at 2 pm:  March 13th, April 10th Contact Amy Marriott at [email or web address] if interested in participating in this group    LOCAL EVENTS AND NEW OFFERINGS  Let's Try Pickleball-$25 for 6 weeks of Pickleball, starting February 2nd.  Contact Sarah Chambers for more details.  [email or web address] NEW:  Parkinson's Social Game Night.  First Thursday of each month, 2:00-4:00 pm.  *Next date is MARCH 7th*.  [facility], [place].  Contact [email or web address] if interested. Parkinson's CarePartner Group for Men is in the works, if interested email [email or web address] [employer] Chair Yoga classes "Train and Gain", Fridays 10 am, [employer].  Contact Gina at [phone].  PWR! Moves Community Fitness Instructor-Led Classes offering at [employer]!  TUESDAYS and Wednesdays 1-2 pm.   Contact Christy Weaver at  [email or web address]  or [phone] (Tuesday classes are modified for chair and standing only) Drumming for Parkinson's will be held on 2nd and 4th Mondays at 11:00 am.   Located at the [employer] ([address] [place].)  Contact Jane Maydian at [email or web address] or [phone]  Dance for Parkinson 's classes will be on Tuesdays 10-11 am starting in February. Located in the [employer], in the first floor of the [employer] ([address] To register:  [email or web address] or [phone] [employer] Parkinson's Tai Chi Class, Mondays at 11 am.  Call [phone] for details Moving Day [place].  Saturday, May 4th, 10 am start.  Register at [email or web address]    ONLINE EDUCATION AND SUPPORT  Parkinson Foundation:  www.parkinson.org  PD Health at Home continues:  Mindfulness Mondays, Wellness Wednesdays, Fitness Fridays  (PWR! Moves as part of Fitness Fridays March 22nd, 1-1:45 pm) Upcoming Education:   Managing "Off" Periods:  Return of Parkinson's Symptoms.  Wednesday, March 20th, 1-2 pm Parkinson's 101.  Wednesday, April 3rd, 1-2 pm Expert Briefing:  Understanding Pain in Parkinson's.   Wednesday, March 13th, 1-2 pm  Research Update:  Working to Halt PD.  Wednesday, April 10th, 1-2 pm Register for virtual education and expert briefings (webinars) at www.[email or web address].org/resources-support/online-education Please check out their website to sign up for emails and see their full online offerings     Michael J Fox Foundation:  www.[email or web address].org   Third Thursday Webinars:  On the third Thursday of every month at 12 p.m. ET, join our free live webinars to learn about various aspects of living with Parkinson's disease and our work to speed medical breakthroughs.  Upcoming Webinar:  Everyday Exposure to Parkinson's:  Environmental Connections to the Disease.  Thursday, March 21st at 12 noon. Check out additional information on their website to see their full online offerings    Davis Phinney Foundation:  www.davisphinneyfoundation.org  Upcoming Webinar:   Nutrition and Parkinson's.  Wednesday, March 6th, 12 noon Webinar Series:  Living with Parkinson's Meetup.   Third Thursdays each month, 3 pm  Care Partner Monthly   Meetup.  With Connie Carpenter Phinney.  First Tuesday of each month, 2 pm  Check out additional information  to Live Well Today on their website    Parkinson and Movement Disorders (PMD) Alliance:  www.pmdalliance.org  NeuroLife Online:  Online Education Events  Sign up for emails, which are sent weekly to give you updates on programming and online offerings    Parkinson's Association of [place]:  www.parkinsonassociation.org  Information on online support groups, education events, and online exercises including Yoga, Parkinson's exercises and more-LOTS of information on links to PD resources and online events  Virtual Support Group through Parkinson's Association of the [place]; next one is scheduled for Wednesday, March 6th  MOVEMENT AND EXERCISE OPPORTUNITIES  PWR! Moves Classes at [place] Exercise Room.  Wednesdays 10 and 11 am.   Contact Amy Marriott, PT [email or web address] if interested.  PWR! Moves Class offerings at [employer]. *TUESDAYS* and Wednesdays 1-2 pm.    Contact Christy Weaver at  [email or web address]    Parkinson's Wellness Recovery (PWR! Moves)  www.pwr4life.org  Info on the PWR! Virtual Experience:  You will have access to our expertise?through self-assessment, guided plans that start with the PD-specific fundamentals, educational content, tips, Q&A with an expert, and a growing library of PD-specific pre-recorded and live exercise classes of varying types and intensity - both physical and cognitive! If that is not enough, we offer 1:1 wellness consultations (in-person or virtual) to personalize your PWR! Virtual Experience.   Parkinson Foundation Fitness Fridays:   As part of the PD Health @ Home program, this free video series focuses each week on one aspect of fitness designed to support people living with Parkinson's.? These weekly videos highlight the Parkinson Foundation fitness guidelines for people with Parkinson's disease.  www.parkinson.org/resources-support/online-education/pdhealth#ff  Dance for PD website is offering free, live-stream  classes throughout the week, as well as links to digital library of classes:  [email or web address]://danceforparkinsons.org/  Virtual dance and Pilates for Parkinson's classes: Click on the Community Tab> Parkinson's Movement Initiative Tab.  To register for classes and for more information, visit www.americandancefestival.org and click the "community" tab.   [employer] Parkinson's Cycling Classes   [employer]:  Thursdays @ Noon-Live classes at [employer] (Contact Margaret Hazen at [email or web address]?or [phone])  [employer]: Virtual Classes Mondays and Thursdays /Live classes Tuesday, Wednesday and Thursday (contact Marlee at [email or web address] ?or [phone])  [place] Steady Boxing  Varied levels of classes are offered Tuesdays and Thursdays at [employer].   Stretching with Maria weekly class is also offered for people with Parkinson's  To observe a class or for more information, call [phone] or email Hillary Savage at [email or web address]   ADDITIONAL SUPPORT AND RESOURCES  Well-Spring Solutions:Online Caregiver Education Opportunities:  www.well-springsolutions.org/caregiver-education/caregiver-support-group.  You may also contact Jodi Kolada at [email or web address] or [phone].     Family Caregiver Winter Retreat.  Thursday, March 7th, 10:15-1:45 at [facility].  Register with Jodi Kolada (see above) Well-Spring Navigator:  Just1Navigator program, a?free service to help individuals and families through the journey of determining care for older adults.  The "Navigator" is a social worker, Nicole Reynolds, who will speak with a prospective client and/or loved ones to provide an assessment of the situation and a set of recommendations for a personalized care plan -- all free of charge, and whether?Well-Spring Solutions offers the needed service or not. If the need is not a service we provide, we are well-connected with reputable programs in  town that we can refer you to.  www.well-springsolutions.org or   to speak with the Navigator, call [phone].     

## 2022-07-18 DIAGNOSIS — M542 Cervicalgia: Secondary | ICD-10-CM | POA: Diagnosis not present

## 2022-07-18 DIAGNOSIS — M79621 Pain in right upper arm: Secondary | ICD-10-CM | POA: Diagnosis not present

## 2022-07-22 ENCOUNTER — Other Ambulatory Visit: Payer: Self-pay | Admitting: Neurology

## 2022-07-23 DIAGNOSIS — M542 Cervicalgia: Secondary | ICD-10-CM | POA: Diagnosis not present

## 2022-07-23 DIAGNOSIS — M79621 Pain in right upper arm: Secondary | ICD-10-CM | POA: Diagnosis not present

## 2022-07-25 DIAGNOSIS — M79621 Pain in right upper arm: Secondary | ICD-10-CM | POA: Diagnosis not present

## 2022-07-25 DIAGNOSIS — M542 Cervicalgia: Secondary | ICD-10-CM | POA: Diagnosis not present

## 2022-08-01 DIAGNOSIS — M542 Cervicalgia: Secondary | ICD-10-CM | POA: Diagnosis not present

## 2022-08-01 DIAGNOSIS — M79621 Pain in right upper arm: Secondary | ICD-10-CM | POA: Diagnosis not present

## 2022-08-06 DIAGNOSIS — M542 Cervicalgia: Secondary | ICD-10-CM | POA: Diagnosis not present

## 2022-08-06 DIAGNOSIS — M79621 Pain in right upper arm: Secondary | ICD-10-CM | POA: Diagnosis not present

## 2022-08-07 DIAGNOSIS — M810 Age-related osteoporosis without current pathological fracture: Secondary | ICD-10-CM | POA: Diagnosis not present

## 2022-08-15 DIAGNOSIS — G20A1 Parkinson's disease without dyskinesia, without mention of fluctuations: Secondary | ICD-10-CM | POA: Diagnosis not present

## 2022-08-15 DIAGNOSIS — F419 Anxiety disorder, unspecified: Secondary | ICD-10-CM | POA: Diagnosis not present

## 2022-08-15 DIAGNOSIS — Z86718 Personal history of other venous thrombosis and embolism: Secondary | ICD-10-CM | POA: Diagnosis not present

## 2022-08-15 DIAGNOSIS — Z6829 Body mass index (BMI) 29.0-29.9, adult: Secondary | ICD-10-CM | POA: Diagnosis not present

## 2022-08-20 DIAGNOSIS — J0100 Acute maxillary sinusitis, unspecified: Secondary | ICD-10-CM | POA: Diagnosis not present

## 2022-08-20 DIAGNOSIS — H40013 Open angle with borderline findings, low risk, bilateral: Secondary | ICD-10-CM | POA: Diagnosis not present

## 2022-08-20 DIAGNOSIS — Z961 Presence of intraocular lens: Secondary | ICD-10-CM | POA: Diagnosis not present

## 2022-08-20 DIAGNOSIS — H04123 Dry eye syndrome of bilateral lacrimal glands: Secondary | ICD-10-CM | POA: Diagnosis not present

## 2022-08-29 DIAGNOSIS — M810 Age-related osteoporosis without current pathological fracture: Secondary | ICD-10-CM | POA: Diagnosis not present

## 2022-09-12 DIAGNOSIS — K219 Gastro-esophageal reflux disease without esophagitis: Secondary | ICD-10-CM | POA: Diagnosis not present

## 2022-09-12 DIAGNOSIS — Z1331 Encounter for screening for depression: Secondary | ICD-10-CM | POA: Diagnosis not present

## 2022-09-12 DIAGNOSIS — D6869 Other thrombophilia: Secondary | ICD-10-CM | POA: Diagnosis not present

## 2022-09-12 DIAGNOSIS — Z0000 Encounter for general adult medical examination without abnormal findings: Secondary | ICD-10-CM | POA: Diagnosis not present

## 2022-09-12 DIAGNOSIS — D6862 Lupus anticoagulant syndrome: Secondary | ICD-10-CM | POA: Diagnosis not present

## 2022-09-12 DIAGNOSIS — Z6829 Body mass index (BMI) 29.0-29.9, adult: Secondary | ICD-10-CM | POA: Diagnosis not present

## 2022-09-12 DIAGNOSIS — G20A1 Parkinson's disease without dyskinesia, without mention of fluctuations: Secondary | ICD-10-CM | POA: Diagnosis not present

## 2022-09-12 DIAGNOSIS — I700 Atherosclerosis of aorta: Secondary | ICD-10-CM | POA: Diagnosis not present

## 2022-09-12 DIAGNOSIS — M62838 Other muscle spasm: Secondary | ICD-10-CM | POA: Diagnosis not present

## 2022-09-30 DIAGNOSIS — Z6829 Body mass index (BMI) 29.0-29.9, adult: Secondary | ICD-10-CM | POA: Diagnosis not present

## 2022-09-30 DIAGNOSIS — R0981 Nasal congestion: Secondary | ICD-10-CM | POA: Diagnosis not present

## 2022-09-30 DIAGNOSIS — K219 Gastro-esophageal reflux disease without esophagitis: Secondary | ICD-10-CM | POA: Diagnosis not present

## 2022-11-04 DIAGNOSIS — Z1231 Encounter for screening mammogram for malignant neoplasm of breast: Secondary | ICD-10-CM | POA: Diagnosis not present

## 2022-11-06 DIAGNOSIS — R3911 Hesitancy of micturition: Secondary | ICD-10-CM | POA: Diagnosis not present

## 2022-11-06 DIAGNOSIS — N952 Postmenopausal atrophic vaginitis: Secondary | ICD-10-CM | POA: Diagnosis not present

## 2022-11-06 DIAGNOSIS — N390 Urinary tract infection, site not specified: Secondary | ICD-10-CM | POA: Diagnosis not present

## 2022-11-08 ENCOUNTER — Encounter: Payer: Self-pay | Admitting: Neurology

## 2022-11-13 DIAGNOSIS — F324 Major depressive disorder, single episode, in partial remission: Secondary | ICD-10-CM | POA: Diagnosis not present

## 2022-11-13 DIAGNOSIS — Z6829 Body mass index (BMI) 29.0-29.9, adult: Secondary | ICD-10-CM | POA: Diagnosis not present

## 2022-11-13 DIAGNOSIS — G20A1 Parkinson's disease without dyskinesia, without mention of fluctuations: Secondary | ICD-10-CM | POA: Diagnosis not present

## 2022-11-13 DIAGNOSIS — M542 Cervicalgia: Secondary | ICD-10-CM | POA: Diagnosis not present

## 2022-11-13 DIAGNOSIS — R0981 Nasal congestion: Secondary | ICD-10-CM | POA: Diagnosis not present

## 2022-11-13 DIAGNOSIS — K219 Gastro-esophageal reflux disease without esophagitis: Secondary | ICD-10-CM | POA: Diagnosis not present

## 2022-11-17 DIAGNOSIS — J028 Acute pharyngitis due to other specified organisms: Secondary | ICD-10-CM | POA: Diagnosis not present

## 2022-11-21 DIAGNOSIS — Z6829 Body mass index (BMI) 29.0-29.9, adult: Secondary | ICD-10-CM | POA: Diagnosis not present

## 2022-11-21 DIAGNOSIS — R0982 Postnasal drip: Secondary | ICD-10-CM | POA: Diagnosis not present

## 2022-11-21 DIAGNOSIS — K209 Esophagitis, unspecified without bleeding: Secondary | ICD-10-CM | POA: Diagnosis not present
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
## 2022-12-16 ENCOUNTER — Ambulatory Visit: Payer: Medicare PPO | Admitting: Neurology

## 2022-12-19 ENCOUNTER — Ambulatory Visit: Payer: Medicare PPO | Admitting: Neurology

## 2022-12-19 ENCOUNTER — Encounter: Payer: Self-pay | Admitting: Neurology

## 2022-12-19 VITALS — BP 126/76 | HR 58 | Ht 61.5 in | Wt 157.8 lb

## 2022-12-19 DIAGNOSIS — R1319 Other dysphagia: Secondary | ICD-10-CM

## 2022-12-19 DIAGNOSIS — G20B2 Parkinson's disease with dyskinesia, with fluctuations: Secondary | ICD-10-CM

## 2022-12-19 MED ORDER — CARBIDOPA-LEVODOPA 25-100 MG PO TABS: ORAL_TABLET | ORAL | Status: DC

## 2022-12-19 NOTE — Patient Instructions (Signed)
 SAVE THE DATE!  We are planning a Parkinsons Disease educational symposium at [facility] in [place] on October 11.  More details to come!  If you would like to be added to our email list to get further information, email [email or web address].  We hope to see you there!

## 2022-12-20 DIAGNOSIS — M4802 Spinal stenosis, cervical region: Secondary | ICD-10-CM | POA: Diagnosis not present

## 2022-12-20 DIAGNOSIS — M47812 Spondylosis without myelopathy or radiculopathy, cervical region: Secondary | ICD-10-CM | POA: Diagnosis not present

## 2022-12-20 DIAGNOSIS — M50221 Other cervical disc displacement at C4-C5 level: Secondary | ICD-10-CM | POA: Diagnosis not present

## 2022-12-20 DIAGNOSIS — M5031 Other cervical disc degeneration,  high cervical region: Secondary | ICD-10-CM | POA: Diagnosis not present

## 2022-12-20 DIAGNOSIS — M4313 Spondylolisthesis, cervicothoracic region: Secondary | ICD-10-CM | POA: Diagnosis not present

## 2022-12-23 DIAGNOSIS — G20A1 Parkinson's disease without dyskinesia, without mention of fluctuations: Secondary | ICD-10-CM | POA: Diagnosis not present

## 2022-12-23 DIAGNOSIS — R1310 Dysphagia, unspecified: Secondary | ICD-10-CM | POA: Diagnosis not present

## 2023-01-07 DIAGNOSIS — M47812 Spondylosis without myelopathy or radiculopathy, cervical region: Secondary | ICD-10-CM | POA: Diagnosis not present

## 2023-01-07 DIAGNOSIS — Z6830 Body mass index (BMI) 30.0-30.9, adult: Secondary | ICD-10-CM | POA: Diagnosis not present

## 2023-01-10 DIAGNOSIS — J342 Deviated nasal septum: Secondary | ICD-10-CM | POA: Diagnosis not present

## 2023-01-10 DIAGNOSIS — R0981 Nasal congestion: Secondary | ICD-10-CM | POA: Diagnosis not present

## 2023-02-04 DIAGNOSIS — H00011 Hordeolum externum right upper eyelid: Secondary | ICD-10-CM | POA: Diagnosis not present

## 2023-02-05 DIAGNOSIS — H00039 Abscess of eyelid unspecified eye, unspecified eyelid: Secondary | ICD-10-CM | POA: Diagnosis not present

## 2023-02-05 DIAGNOSIS — H01001 Unspecified blepharitis right upper eyelid: Secondary | ICD-10-CM | POA: Diagnosis not present

## 2023-02-05 DIAGNOSIS — H6123 Impacted cerumen, bilateral: Secondary | ICD-10-CM | POA: Diagnosis not present

## 2023-02-05 DIAGNOSIS — Z6830 Body mass index (BMI) 30.0-30.9, adult: Secondary | ICD-10-CM | POA: Diagnosis not present

## 2023-02-11 DIAGNOSIS — H52223 Regular astigmatism, bilateral: Secondary | ICD-10-CM | POA: Diagnosis not present

## 2023-02-11 DIAGNOSIS — H5211 Myopia, right eye: Secondary | ICD-10-CM | POA: Diagnosis not present

## 2023-02-11 DIAGNOSIS — H04123 Dry eye syndrome of bilateral lacrimal glands: Secondary | ICD-10-CM | POA: Diagnosis not present

## 2023-02-11 DIAGNOSIS — H00021 Hordeolum internum right upper eyelid: Secondary | ICD-10-CM | POA: Diagnosis not present

## 2023-02-11 DIAGNOSIS — H40013 Open angle with borderline findings, low risk, bilateral: Secondary | ICD-10-CM | POA: Diagnosis not present

## 2023-02-11 DIAGNOSIS — Z961 Presence of intraocular lens: Secondary | ICD-10-CM | POA: Diagnosis not present

## 2023-02-11 DIAGNOSIS — H5231 Anisometropia: Secondary | ICD-10-CM | POA: Diagnosis not present

## 2023-02-18 DIAGNOSIS — H04123 Dry eye syndrome of bilateral lacrimal glands: Secondary | ICD-10-CM | POA: Diagnosis not present

## 2023-02-18 DIAGNOSIS — H40013 Open angle with borderline findings, low risk, bilateral: Secondary | ICD-10-CM | POA: Diagnosis not present

## 2023-02-18 DIAGNOSIS — Z961 Presence of intraocular lens: Secondary | ICD-10-CM | POA: Diagnosis not present

## 2023-02-18 DIAGNOSIS — H00021 Hordeolum internum right upper eyelid: Secondary | ICD-10-CM | POA: Diagnosis not present

## 2023-03-04 ENCOUNTER — Encounter: Payer: Self-pay | Admitting: Neurology

## 2023-03-04 ENCOUNTER — Other Ambulatory Visit: Payer: Self-pay | Admitting: Neurology

## 2023-03-04 DIAGNOSIS — G20B2 Parkinson's disease with dyskinesia, with fluctuations: Secondary | ICD-10-CM

## 2023-03-04 DIAGNOSIS — M810 Age-related osteoporosis without current pathological fracture: Secondary | ICD-10-CM | POA: Diagnosis not present

## 2023-03-05 DIAGNOSIS — J069 Acute upper respiratory infection, unspecified: Secondary | ICD-10-CM | POA: Diagnosis not present

## 2023-03-11 DIAGNOSIS — J329 Chronic sinusitis, unspecified: Secondary | ICD-10-CM | POA: Diagnosis not present

## 2023-03-11 DIAGNOSIS — Z6831 Body mass index (BMI) 31.0-31.9, adult: Secondary | ICD-10-CM | POA: Diagnosis not present

## 2023-03-19 DIAGNOSIS — N390 Urinary tract infection, site not specified: Secondary | ICD-10-CM | POA: Diagnosis not present

## 2023-03-20 DIAGNOSIS — M2550 Pain in unspecified joint: Secondary | ICD-10-CM | POA: Diagnosis not present

## 2023-03-20 DIAGNOSIS — Z6832 Body mass index (BMI) 32.0-32.9, adult: Secondary | ICD-10-CM | POA: Diagnosis not present

## 2023-03-20 DIAGNOSIS — J019 Acute sinusitis, unspecified: Secondary | ICD-10-CM | POA: Diagnosis not present

## 2023-04-16 DIAGNOSIS — Z6832 Body mass index (BMI) 32.0-32.9, adult: Secondary | ICD-10-CM | POA: Diagnosis not present

## 2023-04-16 DIAGNOSIS — Z1151 Encounter for screening for human papillomavirus (HPV): Secondary | ICD-10-CM | POA: Diagnosis not present

## 2023-04-16 DIAGNOSIS — M816 Localized osteoporosis [Lequesne]: Secondary | ICD-10-CM | POA: Diagnosis not present

## 2023-04-16 DIAGNOSIS — N958 Other specified menopausal and perimenopausal disorders: Secondary | ICD-10-CM | POA: Diagnosis not present

## 2023-04-16 DIAGNOSIS — Z124 Encounter for screening for malignant neoplasm of cervix: Secondary | ICD-10-CM | POA: Diagnosis not present

## 2023-04-21 DIAGNOSIS — B372 Candidiasis of skin and nail: Secondary | ICD-10-CM | POA: Diagnosis not present

## 2023-04-21 DIAGNOSIS — R21 Rash and other nonspecific skin eruption: Secondary | ICD-10-CM | POA: Diagnosis not present

## 2023-04-24 DIAGNOSIS — L820 Inflamed seborrheic keratosis: Secondary | ICD-10-CM | POA: Diagnosis not present

## 2023-05-24 ENCOUNTER — Encounter: Payer: Self-pay | Admitting: Neurology

## 2023-05-29 ENCOUNTER — Other Ambulatory Visit: Payer: Self-pay | Admitting: Neurology

## 2023-05-29 DIAGNOSIS — G20B2 Parkinson's disease with dyskinesia, with fluctuations: Secondary | ICD-10-CM

## 2023-05-31 ENCOUNTER — Encounter (HOSPITAL_BASED_OUTPATIENT_CLINIC_OR_DEPARTMENT_OTHER): Payer: Self-pay

## 2023-05-31 ENCOUNTER — Ambulatory Visit (HOSPITAL_BASED_OUTPATIENT_CLINIC_OR_DEPARTMENT_OTHER)
Admission: EM | Admit: 2023-05-31 | Discharge: 2023-05-31 | Disposition: A | Discharge status: Home / Self Care | Payer: Medicare PPO | Attending: Family Medicine | Admitting: Family Medicine

## 2023-05-31 DIAGNOSIS — R112 Nausea with vomiting, unspecified: Secondary | ICD-10-CM

## 2023-05-31 DIAGNOSIS — Y92009 Unspecified place in unspecified non-institutional (private) residence as the place of occurrence of the external cause: Secondary | ICD-10-CM

## 2023-05-31 DIAGNOSIS — W19XXXA Unspecified fall, initial encounter: Secondary | ICD-10-CM

## 2023-05-31 DIAGNOSIS — R051 Acute cough: Secondary | ICD-10-CM

## 2023-05-31 DIAGNOSIS — R531 Weakness: Secondary | ICD-10-CM

## 2023-05-31 DIAGNOSIS — E860 Dehydration: Secondary | ICD-10-CM | POA: Diagnosis not present

## 2023-05-31 DIAGNOSIS — R509 Fever, unspecified: Secondary | ICD-10-CM | POA: Diagnosis not present

## 2023-05-31 DIAGNOSIS — J101 Influenza due to other identified influenza virus with other respiratory manifestations: Secondary | ICD-10-CM | POA: Diagnosis not present

## 2023-05-31 LAB — POC COVID19/FLU A&B COMBO
Covid Antigen, POC: NEGATIVE
Influenza A Antigen, POC: POSITIVE — AB
Influenza B Antigen, POC: NEGATIVE

## 2023-05-31 MED ORDER — ONDANSETRON 4 MG PO TBDP: 4.0000 mg | ORAL_TABLET | Freq: Three times a day (TID) | ORAL | 0 refills | Status: DC | PRN

## 2023-05-31 MED ORDER — OSELTAMIVIR PHOSPHATE 75 MG PO CAPS
75.0000 mg | ORAL_CAPSULE | Freq: Two times a day (BID) | ORAL | 0 refills | Status: DC
Start: 2023-05-31 — End: 2023-06-24

## 2023-05-31 NOTE — ED Triage Notes (Addendum)
Patient has hx of parkinson's. Usually ambulatory. States was having a "good" day yesterday early  on then spiked a low grade fever. Fell, landing on back. Began having sore throat. 3 episodes of vomitting during the night. Reports headache and nausea on arrival. Dry heaves. Denies urinary incontinence, dysuria, or urgency.
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
## 2023-05-31 NOTE — Discharge Instructions (Addendum)
Negative for type B flu and COVID.  Positive for influenza type A.  Tamiflu, 75 mg, twice daily for 5 days.  She has had nausea and vomiting.  Ondansetron, 4 mg, ODT, melt on tongue, every 8 hours, as needed for nausea or vomiting.  Get plenty of fluids.  She really needs to get hydrated.  Provided education about hydration.  Get plenty of rest.  Be cautious with movements due to risk of falls and weakness.  May use OTC cough syrups, such as Delsym, but be cautious about over sedating the patient.  She fell during the night.  She was getting back into bed after toileting and slipped on a stepstool that she uses to get into bed she fell against her bedside table and wall.  Be cautious movements and definitely work on rehydration to reduce risk of fall.  Follow-up if symptoms do not improve, worsen or new symptoms occur.

## 2023-06-12 DIAGNOSIS — Z6831 Body mass index (BMI) 31.0-31.9, adult: Secondary | ICD-10-CM | POA: Diagnosis not present

## 2023-06-12 DIAGNOSIS — J329 Chronic sinusitis, unspecified: Secondary | ICD-10-CM | POA: Diagnosis not present

## 2023-06-14 IMAGING — RF DG ESOPHAGUS
5 series · 14 of 20 positions shown · non-contrast
Comparison: None.

CLINICAL DATA: Pharyngoesophageal dysphagia. Solid food sticking in
throat. Zenker diverticulum seen on modified barium swallow.

EXAM:
ESOPHOGRAM / BARIUM SWALLOW / BARIUM TABLET STUDY
TECHNIQUE: Combined double contrast and single contrast examination performed
using effervescent crystals, thick barium liquid, and thin barium
liquid. The patient was observed with fluoroscopy swallowing a 13 mm
barium sulphate tablet.
FLUOROSCOPY:
Radiation Exposure Index (as provided by the fluoroscopic device):
12.8 mGy Kerma

[Series 1: sequence · 3 of 31 frames shown (1 of 3)]
[frame 5/31]
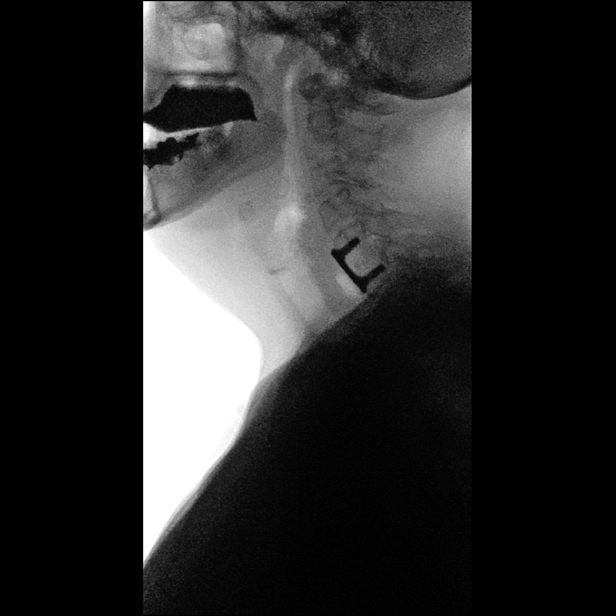
[frame 16/31]
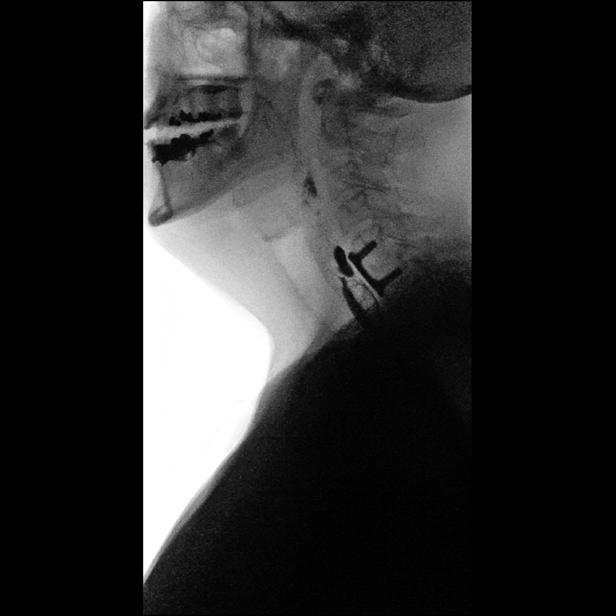
[frame 27/31]
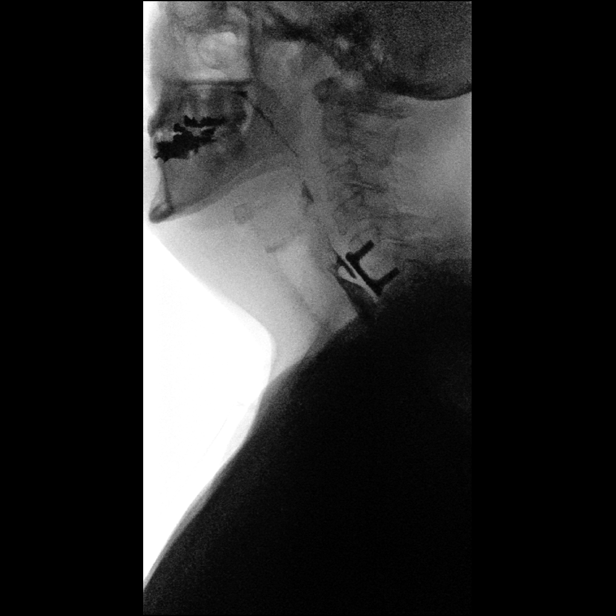

[Series 2: sequence · 3 of 25 frames shown (2 of 3)]
[frame 12/25]
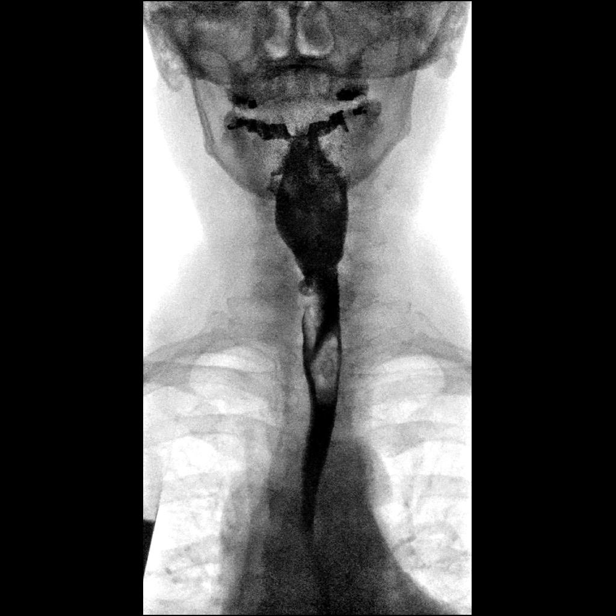
[frame 13/25]
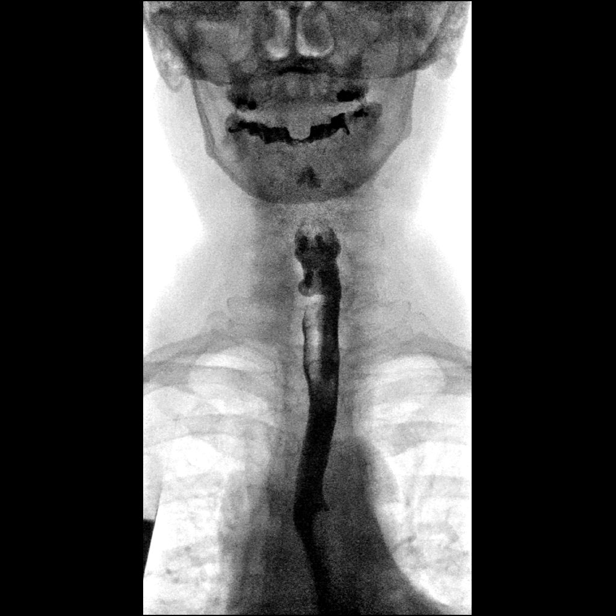
[frame 22/25]
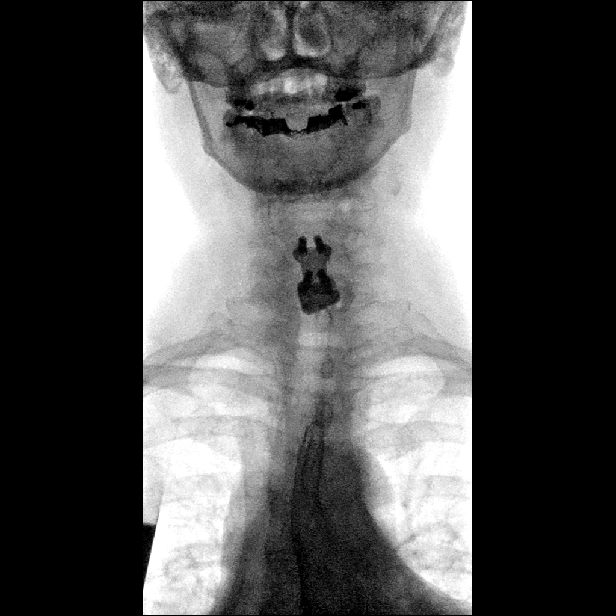

[Series 3: one shot · 2 of 4 slices shown (1 of 2)]
[im 2/4]
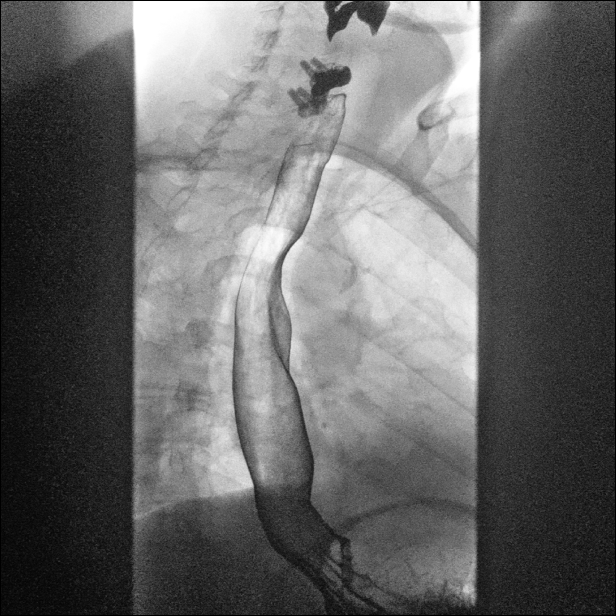
[im 3/4]
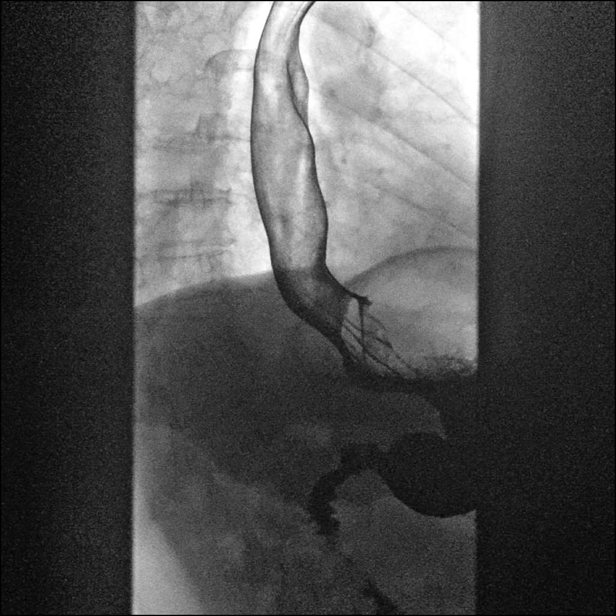

[Series 4: sequence · 3 of 11 frames shown (3 of 3)]
[frame 2/11]
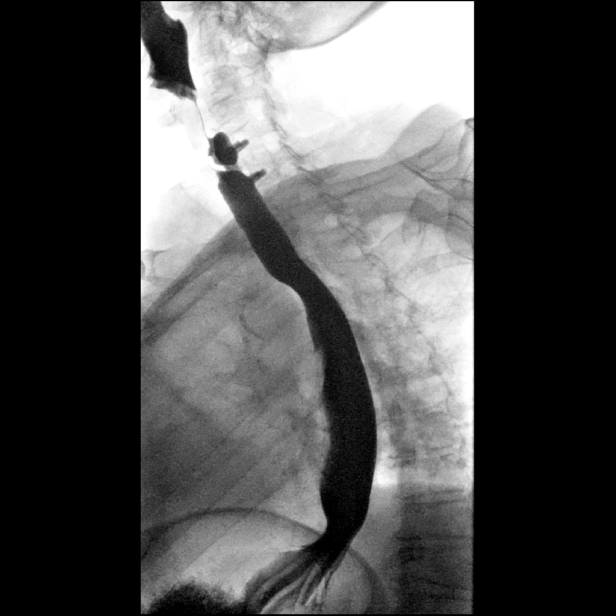
[frame 4/11]
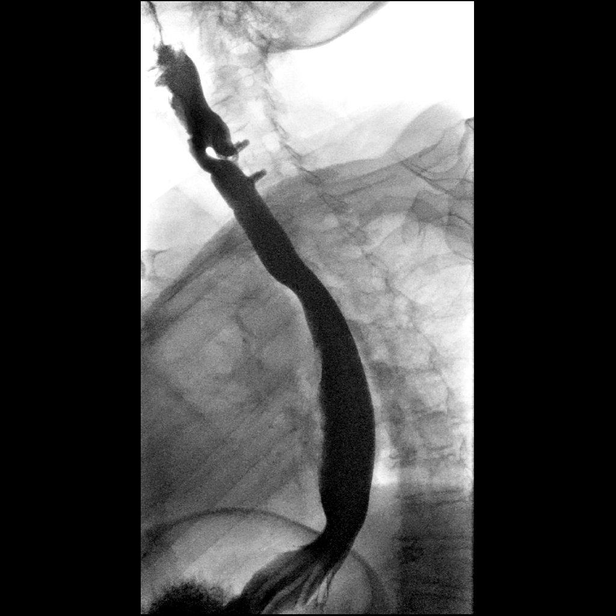
[frame 10/11]
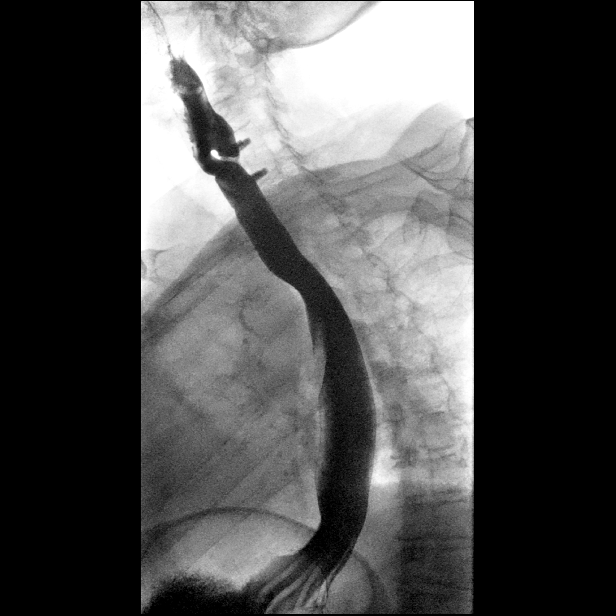

[Series 5: one shot · 3 of 4 slices shown (2 of 2)]
[im 1/4]
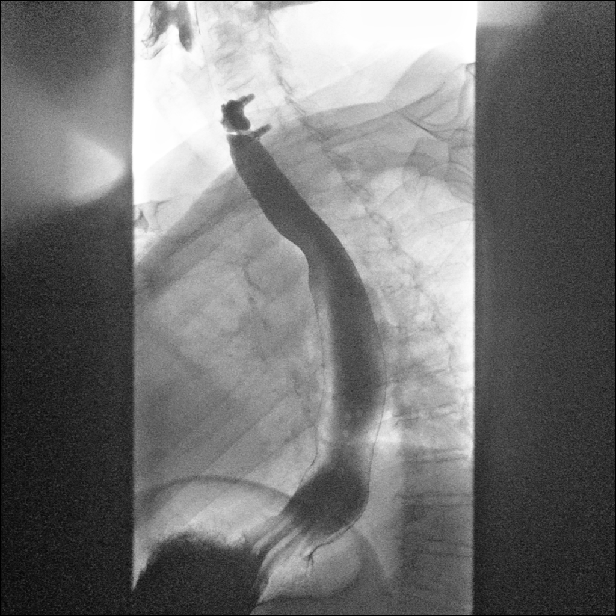
[im 2/4]
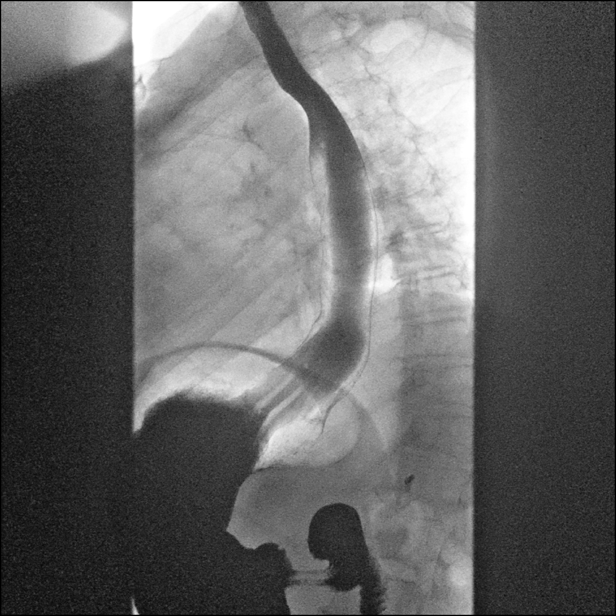
[im 4/4]
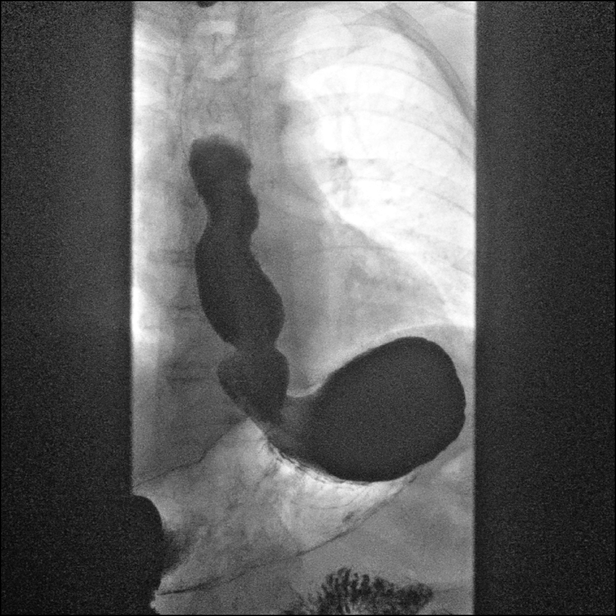

[14 of 20 positions shown; findings below may reference images not displayed]

FINDINGS: No evidence of vestibular penetration or aspiration during
swallowing. Hypertrophy of the cricopharyngeus muscle is noted. A
small Zenker diverticulum is seen at the level of C4-5. Anterior
cervical spine fusion hardware is seen at C5-6, however this shows
no effect on the esophagus at this level.

No evidence of esophageal mass or stricture. No findings of
esophagitis noted. Moderate esophageal dysmotility is seen, with
break up of primary peristalsis in the midthoracic esophagus causing
esophageal stasis.

A small sliding hiatal hernia is demonstrated. Moderate
gastroesophageal reflux is also seen to the level of the midthoracic
esophagus. An ingested 13mm barium tablet passed freely through the
esophagus, and into the stomach.
IMPRESSION: Hypertrophy of cricopharyngeus muscle, with small Zenker
diverticulum.

Small sliding hiatal hernia and moderate gastroesophageal reflux.

No evidence of esophageal stricture or mass.

Moderate esophageal dysmotility.
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
## 2023-06-24 ENCOUNTER — Encounter: Payer: Self-pay | Admitting: Neurology

## 2023-06-24 ENCOUNTER — Ambulatory Visit: Payer: Medicare PPO | Admitting: Neurology

## 2023-06-24 VITALS — BP 93/58 | HR 94 | Ht 61.5 in | Wt 168.0 lb

## 2023-06-24 DIAGNOSIS — R1319 Other dysphagia: Secondary | ICD-10-CM | POA: Diagnosis not present

## 2023-06-24 DIAGNOSIS — G20B2 Parkinson's disease with dyskinesia, with fluctuations: Secondary | ICD-10-CM | POA: Diagnosis not present

## 2023-06-24 DIAGNOSIS — F330 Major depressive disorder, recurrent, mild: Secondary | ICD-10-CM | POA: Diagnosis not present

## 2023-06-24 MED ORDER — CARBIDOPA-LEVODOPA 25-100 MG PO TABS: ORAL_TABLET | ORAL | 1 refills | Status: DC

## 2023-07-01 DIAGNOSIS — N390 Urinary tract infection, site not specified: Secondary | ICD-10-CM | POA: Diagnosis not present

## 2023-07-29 DIAGNOSIS — N6320 Unspecified lump in the left breast, unspecified quadrant: Secondary | ICD-10-CM | POA: Diagnosis not present

## 2023-07-29 DIAGNOSIS — N6002 Solitary cyst of left breast: Secondary | ICD-10-CM | POA: Diagnosis not present

## 2023-08-14 DIAGNOSIS — T8089XA Other complications following infusion, transfusion and therapeutic injection, initial encounter: Secondary | ICD-10-CM | POA: Diagnosis not present

## 2023-08-14 DIAGNOSIS — M810 Age-related osteoporosis without current pathological fracture: Secondary | ICD-10-CM | POA: Diagnosis not present

## 2023-08-19 DIAGNOSIS — Z961 Presence of intraocular lens: Secondary | ICD-10-CM | POA: Diagnosis not present

## 2023-08-19 DIAGNOSIS — H04123 Dry eye syndrome of bilateral lacrimal glands: Secondary | ICD-10-CM | POA: Diagnosis not present

## 2023-08-19 DIAGNOSIS — H40013 Open angle with borderline findings, low risk, bilateral: Secondary | ICD-10-CM | POA: Diagnosis not present

## 2023-09-03 DIAGNOSIS — M47812 Spondylosis without myelopathy or radiculopathy, cervical region: Secondary | ICD-10-CM | POA: Diagnosis not present

## 2023-09-03 DIAGNOSIS — Z6831 Body mass index (BMI) 31.0-31.9, adult: Secondary | ICD-10-CM | POA: Diagnosis not present

## 2023-09-03 DIAGNOSIS — J029 Acute pharyngitis, unspecified: Secondary | ICD-10-CM | POA: Diagnosis not present

## 2023-09-29 DIAGNOSIS — Z79899 Other long term (current) drug therapy: Secondary | ICD-10-CM | POA: Diagnosis not present

## 2023-09-29 DIAGNOSIS — R7303 Prediabetes: Secondary | ICD-10-CM | POA: Diagnosis not present

## 2023-09-29 DIAGNOSIS — M79641 Pain in right hand: Secondary | ICD-10-CM | POA: Diagnosis not present

## 2023-09-29 DIAGNOSIS — M19041 Primary osteoarthritis, right hand: Secondary | ICD-10-CM | POA: Diagnosis not present

## 2023-09-29 DIAGNOSIS — D692 Other nonthrombocytopenic purpura: Secondary | ICD-10-CM | POA: Diagnosis not present

## 2023-09-29 DIAGNOSIS — Z6831 Body mass index (BMI) 31.0-31.9, adult: Secondary | ICD-10-CM | POA: Diagnosis not present

## 2023-10-05 DIAGNOSIS — R350 Frequency of micturition: Secondary | ICD-10-CM | POA: Diagnosis not present

## 2023-10-05 DIAGNOSIS — R32 Unspecified urinary incontinence: Secondary | ICD-10-CM | POA: Diagnosis not present

## 2023-10-06 DIAGNOSIS — N390 Urinary tract infection, site not specified: Secondary | ICD-10-CM | POA: Diagnosis not present

## 2023-10-06 DIAGNOSIS — Z6831 Body mass index (BMI) 31.0-31.9, adult: Secondary | ICD-10-CM | POA: Diagnosis not present

## 2023-10-16 DIAGNOSIS — D649 Anemia, unspecified: Secondary | ICD-10-CM | POA: Diagnosis not present

## 2023-10-27 ENCOUNTER — Encounter: Payer: Self-pay | Admitting: Neurology

## 2023-10-27 ENCOUNTER — Other Ambulatory Visit: Payer: Self-pay | Admitting: Oncology

## 2023-10-27 DIAGNOSIS — D508 Other iron deficiency anemias: Secondary | ICD-10-CM
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
## 2023-10-28 ENCOUNTER — Encounter: Payer: Self-pay | Admitting: Oncology

## 2023-10-28 ENCOUNTER — Inpatient Hospital Stay: Attending: Oncology | Admitting: Oncology

## 2023-10-28 ENCOUNTER — Inpatient Hospital Stay

## 2023-10-28 VITALS — BP 145/73 | HR 74 | Temp 98.6°F | Resp 16 | Ht 61.25 in | Wt 171.0 lb

## 2023-10-28 DIAGNOSIS — E785 Hyperlipidemia, unspecified: Secondary | ICD-10-CM | POA: Insufficient documentation

## 2023-10-28 DIAGNOSIS — I700 Atherosclerosis of aorta: Secondary | ICD-10-CM | POA: Diagnosis not present

## 2023-10-28 DIAGNOSIS — K219 Gastro-esophageal reflux disease without esophagitis: Secondary | ICD-10-CM | POA: Insufficient documentation

## 2023-10-28 DIAGNOSIS — Z79899 Other long term (current) drug therapy: Secondary | ICD-10-CM | POA: Diagnosis not present

## 2023-10-28 DIAGNOSIS — M810 Age-related osteoporosis without current pathological fracture: Secondary | ICD-10-CM | POA: Diagnosis not present

## 2023-10-28 DIAGNOSIS — Z86718 Personal history of other venous thrombosis and embolism: Secondary | ICD-10-CM | POA: Diagnosis not present

## 2023-10-28 DIAGNOSIS — G20A1 Parkinson's disease without dyskinesia, without mention of fluctuations: Secondary | ICD-10-CM | POA: Insufficient documentation

## 2023-10-28 DIAGNOSIS — D508 Other iron deficiency anemias: Secondary | ICD-10-CM

## 2023-10-28 DIAGNOSIS — E559 Vitamin D deficiency, unspecified: Secondary | ICD-10-CM | POA: Insufficient documentation

## 2023-10-28 DIAGNOSIS — Z8673 Personal history of transient ischemic attack (TIA), and cerebral infarction without residual deficits: Secondary | ICD-10-CM | POA: Diagnosis not present

## 2023-10-28 DIAGNOSIS — Z809 Family history of malignant neoplasm, unspecified: Secondary | ICD-10-CM | POA: Diagnosis not present

## 2023-10-28 DIAGNOSIS — D509 Iron deficiency anemia, unspecified: Secondary | ICD-10-CM | POA: Insufficient documentation

## 2023-10-28 DIAGNOSIS — Z803 Family history of malignant neoplasm of breast: Secondary | ICD-10-CM | POA: Insufficient documentation

## 2023-10-28 DIAGNOSIS — Z7901 Long term (current) use of anticoagulants: Secondary | ICD-10-CM | POA: Diagnosis not present

## 2023-10-28 LAB — CBC WITH DIFFERENTIAL (CANCER CENTER ONLY)
Abs Immature Granulocytes: 0.06 10*3/uL (ref 0.00–0.07)
Basophils Absolute: 0 10*3/uL (ref 0.0–0.1)
Basophils Relative: 0 %
Eosinophils Absolute: 0.2 10*3/uL (ref 0.0–0.5)
Eosinophils Relative: 2 %
HCT: 35.5 % — ABNORMAL LOW (ref 36.0–46.0)
Hemoglobin: 10.8 g/dL — ABNORMAL LOW (ref 12.0–15.0)
Immature Granulocytes: 1 %
Lymphocytes Relative: 23 %
Lymphs Abs: 2.4 10*3/uL (ref 0.7–4.0)
MCH: 23 pg — ABNORMAL LOW (ref 26.0–34.0)
MCHC: 30.4 g/dL (ref 30.0–36.0)
MCV: 75.5 fL — ABNORMAL LOW (ref 80.0–100.0)
Monocytes Absolute: 0.8 10*3/uL (ref 0.1–1.0)
Monocytes Relative: 8 %
Neutro Abs: 6.9 10*3/uL (ref 1.7–7.7)
Neutrophils Relative %: 66 %
Platelet Count: 210 10*3/uL (ref 150–400)
RBC: 4.7 MIL/uL (ref 3.87–5.11)
RDW: 18.7 % — ABNORMAL HIGH (ref 11.5–15.5)
WBC Count: 10.4 10*3/uL (ref 4.0–10.5)
nRBC: 0 % (ref 0.0–0.2)
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
## 2023-11-03 ENCOUNTER — Encounter: Payer: Self-pay | Admitting: Oncology

## 2023-11-03 DIAGNOSIS — D509 Iron deficiency anemia, unspecified: Secondary | ICD-10-CM | POA: Insufficient documentation

## 2023-11-04 ENCOUNTER — Inpatient Hospital Stay

## 2023-11-04 VITALS — BP 136/75 | HR 71 | Temp 98.0°F | Resp 20

## 2023-11-04 DIAGNOSIS — D509 Iron deficiency anemia, unspecified: Secondary | ICD-10-CM | POA: Diagnosis not present

## 2023-11-04 DIAGNOSIS — M810 Age-related osteoporosis without current pathological fracture: Secondary | ICD-10-CM | POA: Diagnosis not present

## 2023-11-04 DIAGNOSIS — E559 Vitamin D deficiency, unspecified: Secondary | ICD-10-CM | POA: Diagnosis not present

## 2023-11-04 DIAGNOSIS — I700 Atherosclerosis of aorta: Secondary | ICD-10-CM | POA: Diagnosis not present

## 2023-11-04 DIAGNOSIS — G20A1 Parkinson's disease without dyskinesia, without mention of fluctuations: Secondary | ICD-10-CM | POA: Diagnosis not present

## 2023-11-04 DIAGNOSIS — K219 Gastro-esophageal reflux disease without esophagitis: Secondary | ICD-10-CM | POA: Diagnosis not present

## 2023-11-04 DIAGNOSIS — Z79899 Other long term (current) drug therapy: Secondary | ICD-10-CM | POA: Diagnosis not present

## 2023-11-04 DIAGNOSIS — Z86718 Personal history of other venous thrombosis and embolism: Secondary | ICD-10-CM | POA: Diagnosis not present

## 2023-11-04 DIAGNOSIS — E785 Hyperlipidemia, unspecified: Secondary | ICD-10-CM | POA: Diagnosis not present

## 2023-11-04 DIAGNOSIS — D508 Other iron deficiency anemias: Secondary | ICD-10-CM

## 2023-11-04 MED ORDER — SODIUM CHLORIDE 0.9% FLUSH: 10.0000 mL | Freq: Once | INTRAVENOUS | Status: DC | PRN

## 2023-11-04 MED ORDER — IRON SUCROSE 20 MG/ML IV SOLN
200.0000 mg | Freq: Once | INTRAVENOUS | Status: AC
  Administered 2023-11-04: 200 mg via INTRAVENOUS
  Filled 2023-11-04: qty 10

## 2023-11-04 NOTE — Patient Instructions (Signed)

## 2023-11-06 ENCOUNTER — Inpatient Hospital Stay

## 2023-11-06 VITALS — BP 163/93 | HR 77 | Temp 97.9°F | Resp 18

## 2023-11-06 DIAGNOSIS — E785 Hyperlipidemia, unspecified: Secondary | ICD-10-CM | POA: Diagnosis not present

## 2023-11-06 DIAGNOSIS — D508 Other iron deficiency anemias: Secondary | ICD-10-CM

## 2023-11-06 DIAGNOSIS — G20A1 Parkinson's disease without dyskinesia, without mention of fluctuations: Secondary | ICD-10-CM | POA: Diagnosis not present

## 2023-11-06 DIAGNOSIS — M810 Age-related osteoporosis without current pathological fracture: Secondary | ICD-10-CM | POA: Diagnosis not present

## 2023-11-06 DIAGNOSIS — E559 Vitamin D deficiency, unspecified: Secondary | ICD-10-CM | POA: Diagnosis not present

## 2023-11-06 DIAGNOSIS — I700 Atherosclerosis of aorta: Secondary | ICD-10-CM | POA: Diagnosis not present

## 2023-11-06 DIAGNOSIS — Z86718 Personal history of other venous thrombosis and embolism: Secondary | ICD-10-CM | POA: Diagnosis not present

## 2023-11-06 DIAGNOSIS — D509 Iron deficiency anemia, unspecified: Secondary | ICD-10-CM | POA: Diagnosis not present

## 2023-11-06 DIAGNOSIS — K219 Gastro-esophageal reflux disease without esophagitis: Secondary | ICD-10-CM | POA: Diagnosis not present

## 2023-11-06 DIAGNOSIS — Z79899 Other long term (current) drug therapy: Secondary | ICD-10-CM | POA: Diagnosis not present

## 2023-11-06 MED ORDER — SODIUM CHLORIDE 0.9% FLUSH
10.0000 mL | Freq: Once | INTRAVENOUS | Status: AC | PRN
Start: 2023-11-06 — End: 2023-11-06
  Administered 2023-11-06: 10 mL

## 2023-11-06 MED ORDER — IRON SUCROSE 20 MG/ML IV SOLN
200.0000 mg | Freq: Once | INTRAVENOUS | Status: AC
  Administered 2023-11-06: 200 mg via INTRAVENOUS
  Filled 2023-11-06: qty 10

## 2023-11-08 ENCOUNTER — Emergency Department (HOSPITAL_BASED_OUTPATIENT_CLINIC_OR_DEPARTMENT_OTHER)

## 2023-11-08 ENCOUNTER — Emergency Department (HOSPITAL_BASED_OUTPATIENT_CLINIC_OR_DEPARTMENT_OTHER)
Admission: EM | Admit: 2023-11-08 | Discharge: 2023-11-08 | Disposition: A | Discharge status: Home / Self Care | Attending: Emergency Medicine | Admitting: Emergency Medicine

## 2023-11-08 ENCOUNTER — Encounter (HOSPITAL_BASED_OUTPATIENT_CLINIC_OR_DEPARTMENT_OTHER): Payer: Self-pay | Admitting: Emergency Medicine

## 2023-11-08 DIAGNOSIS — S0990XA Unspecified injury of head, initial encounter: Secondary | ICD-10-CM | POA: Diagnosis not present

## 2023-11-08 DIAGNOSIS — Z7901 Long term (current) use of anticoagulants: Secondary | ICD-10-CM | POA: Diagnosis not present

## 2023-11-08 DIAGNOSIS — S0101XA Laceration without foreign body of scalp, initial encounter: Secondary | ICD-10-CM | POA: Insufficient documentation

## 2023-11-08 DIAGNOSIS — W2100XA Struck by hit or thrown ball, unspecified type, initial encounter: Secondary | ICD-10-CM | POA: Insufficient documentation

## 2023-11-08 DIAGNOSIS — S0003XA Contusion of scalp, initial encounter: Secondary | ICD-10-CM | POA: Diagnosis not present

## 2023-11-08 DIAGNOSIS — M47812 Spondylosis without myelopathy or radiculopathy, cervical region: Secondary | ICD-10-CM | POA: Diagnosis not present

## 2023-11-08 DIAGNOSIS — M4802 Spinal stenosis, cervical region: Secondary | ICD-10-CM | POA: Diagnosis not present

## 2023-11-08 DIAGNOSIS — Z043 Encounter for examination and observation following other accident: Secondary | ICD-10-CM | POA: Diagnosis not present

## 2023-11-08 DIAGNOSIS — M4312 Spondylolisthesis, cervical region: Secondary | ICD-10-CM | POA: Diagnosis not present

## 2023-11-08 NOTE — ED Triage Notes (Signed)
 Pt reports she was sitting on an exercise ball and she fell back and hit head on table; takes Xarelto; denies LOC; laceration to posterior head
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
## 2023-11-08 NOTE — ED Notes (Signed)
 Laceration to back of head, no active hemorrhage.

## 2023-11-08 NOTE — ED Notes (Signed)

## 2023-11-08 NOTE — Discharge Instructions (Signed)
 For bleeding hold steady pressure.  Keep wound clean with gentle soap and water. For vomiting, confusion, severe headache return to the emergency room.  Use Tylenol  every 4 as needed for pain.

## 2023-11-08 NOTE — ED Notes (Signed)
 Wobbly at bedside but able to ambulate without assistance from wheelchair.

## 2023-11-10 ENCOUNTER — Inpatient Hospital Stay

## 2023-11-10 VITALS — BP 120/77 | HR 72 | Temp 98.2°F | Resp 16

## 2023-11-10 DIAGNOSIS — E785 Hyperlipidemia, unspecified: Secondary | ICD-10-CM | POA: Diagnosis not present

## 2023-11-10 DIAGNOSIS — I700 Atherosclerosis of aorta: Secondary | ICD-10-CM | POA: Diagnosis not present

## 2023-11-10 DIAGNOSIS — E559 Vitamin D deficiency, unspecified: Secondary | ICD-10-CM | POA: Diagnosis not present

## 2023-11-10 DIAGNOSIS — K219 Gastro-esophageal reflux disease without esophagitis: Secondary | ICD-10-CM | POA: Diagnosis not present

## 2023-11-10 DIAGNOSIS — Z79899 Other long term (current) drug therapy: Secondary | ICD-10-CM | POA: Diagnosis not present

## 2023-11-10 DIAGNOSIS — Z86718 Personal history of other venous thrombosis and embolism: Secondary | ICD-10-CM | POA: Diagnosis not present

## 2023-11-10 DIAGNOSIS — M810 Age-related osteoporosis without current pathological fracture: Secondary | ICD-10-CM | POA: Diagnosis not present

## 2023-11-10 DIAGNOSIS — D509 Iron deficiency anemia, unspecified: Secondary | ICD-10-CM | POA: Diagnosis not present

## 2023-11-10 DIAGNOSIS — G20A1 Parkinson's disease without dyskinesia, without mention of fluctuations: Secondary | ICD-10-CM | POA: Diagnosis not present

## 2023-11-10 DIAGNOSIS — D508 Other iron deficiency anemias: Secondary | ICD-10-CM

## 2023-11-10 MED ORDER — IRON SUCROSE 20 MG/ML IV SOLN
200.0000 mg | Freq: Once | INTRAVENOUS | Status: AC
  Administered 2023-11-10: 200 mg via INTRAVENOUS
  Filled 2023-11-10: qty 10

## 2023-11-10 NOTE — Patient Instructions (Signed)

## 2023-11-12 ENCOUNTER — Inpatient Hospital Stay

## 2023-11-12 VITALS — BP 136/64 | HR 75 | Temp 98.0°F | Resp 18

## 2023-11-12 DIAGNOSIS — D508 Other iron deficiency anemias: Secondary | ICD-10-CM

## 2023-11-12 DIAGNOSIS — S41111A Laceration without foreign body of right upper arm, initial encounter: Secondary | ICD-10-CM | POA: Diagnosis not present

## 2023-11-12 DIAGNOSIS — K219 Gastro-esophageal reflux disease without esophagitis: Secondary | ICD-10-CM | POA: Diagnosis not present

## 2023-11-12 DIAGNOSIS — I700 Atherosclerosis of aorta: Secondary | ICD-10-CM | POA: Diagnosis not present

## 2023-11-12 DIAGNOSIS — N390 Urinary tract infection, site not specified: Secondary | ICD-10-CM | POA: Diagnosis not present

## 2023-11-12 DIAGNOSIS — E785 Hyperlipidemia, unspecified: Secondary | ICD-10-CM | POA: Diagnosis not present

## 2023-11-12 DIAGNOSIS — E559 Vitamin D deficiency, unspecified: Secondary | ICD-10-CM | POA: Diagnosis not present

## 2023-11-12 DIAGNOSIS — M810 Age-related osteoporosis without current pathological fracture: Secondary | ICD-10-CM | POA: Diagnosis not present

## 2023-11-12 DIAGNOSIS — Z86718 Personal history of other venous thrombosis and embolism: Secondary | ICD-10-CM | POA: Diagnosis not present

## 2023-11-12 DIAGNOSIS — G20A1 Parkinson's disease without dyskinesia, without mention of fluctuations: Secondary | ICD-10-CM | POA: Diagnosis not present

## 2023-11-12 DIAGNOSIS — Z79899 Other long term (current) drug therapy: Secondary | ICD-10-CM | POA: Diagnosis not present

## 2023-11-12 DIAGNOSIS — D509 Iron deficiency anemia, unspecified: Secondary | ICD-10-CM | POA: Diagnosis not present

## 2023-11-12 MED ORDER — IRON SUCROSE 20 MG/ML IV SOLN
200.0000 mg | Freq: Once | INTRAVENOUS | Status: AC
  Administered 2023-11-12: 200 mg via INTRAVENOUS
  Filled 2023-11-12: qty 10

## 2023-11-12 MED ORDER — SODIUM CHLORIDE 0.9% FLUSH
10.0000 mL | Freq: Once | INTRAVENOUS | Status: AC | PRN
  Administered 2023-11-12: 10 mL

## 2023-11-14 ENCOUNTER — Inpatient Hospital Stay

## 2023-11-14 VITALS — BP 126/58 | HR 66 | Temp 98.1°F | Resp 18

## 2023-11-14 DIAGNOSIS — M810 Age-related osteoporosis without current pathological fracture: Secondary | ICD-10-CM | POA: Diagnosis not present

## 2023-11-14 DIAGNOSIS — D508 Other iron deficiency anemias: Secondary | ICD-10-CM

## 2023-11-14 DIAGNOSIS — E559 Vitamin D deficiency, unspecified: Secondary | ICD-10-CM | POA: Diagnosis not present

## 2023-11-14 DIAGNOSIS — K219 Gastro-esophageal reflux disease without esophagitis: Secondary | ICD-10-CM | POA: Diagnosis not present

## 2023-11-14 DIAGNOSIS — I700 Atherosclerosis of aorta: Secondary | ICD-10-CM | POA: Diagnosis not present

## 2023-11-14 DIAGNOSIS — D509 Iron deficiency anemia, unspecified: Secondary | ICD-10-CM | POA: Diagnosis not present

## 2023-11-14 DIAGNOSIS — Z86718 Personal history of other venous thrombosis and embolism: Secondary | ICD-10-CM | POA: Diagnosis not present

## 2023-11-14 DIAGNOSIS — G20A1 Parkinson's disease without dyskinesia, without mention of fluctuations: Secondary | ICD-10-CM | POA: Diagnosis not present

## 2023-11-14 DIAGNOSIS — E785 Hyperlipidemia, unspecified: Secondary | ICD-10-CM | POA: Diagnosis not present

## 2023-11-14 DIAGNOSIS — Z79899 Other long term (current) drug therapy: Secondary | ICD-10-CM | POA: Diagnosis not present

## 2023-11-14 MED ORDER — SODIUM CHLORIDE 0.9 % IV SOLN: INTRAVENOUS | Status: DC

## 2023-11-14 MED ORDER — IRON SUCROSE 20 MG/ML IV SOLN
200.0000 mg | Freq: Once | INTRAVENOUS | Status: AC
  Administered 2023-11-14: 200 mg via INTRAVENOUS
  Filled 2023-11-14: qty 10

## 2023-11-14 MED ORDER — SODIUM CHLORIDE 0.9% FLUSH
10.0000 mL | Freq: Once | INTRAVENOUS | Status: AC | PRN
  Administered 2023-11-14: 10 mL

## 2023-11-14 NOTE — Patient Instructions (Signed)

## 2023-11-23 ENCOUNTER — Encounter: Payer: Self-pay | Admitting: Oncology
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
## 2023-12-16 ENCOUNTER — Ambulatory Visit: Admitting: Neurology

## 2023-12-16 ENCOUNTER — Encounter: Payer: Self-pay | Admitting: Neurology

## 2023-12-16 VITALS — BP 128/76 | HR 76 | Ht 61.0 in | Wt 167.0 lb

## 2023-12-16 DIAGNOSIS — G20B1 Parkinson's disease with dyskinesia, without mention of fluctuations: Secondary | ICD-10-CM

## 2023-12-16 DIAGNOSIS — R1319 Other dysphagia: Secondary | ICD-10-CM

## 2023-12-16 NOTE — Patient Instructions (Signed)

## 2023-12-17 DIAGNOSIS — R1310 Dysphagia, unspecified: Secondary | ICD-10-CM | POA: Diagnosis not present

## 2023-12-17 DIAGNOSIS — D649 Anemia, unspecified: Secondary | ICD-10-CM | POA: Diagnosis not present

## 2023-12-21 ENCOUNTER — Ambulatory Visit (HOSPITAL_BASED_OUTPATIENT_CLINIC_OR_DEPARTMENT_OTHER)
Admission: RE | Admit: 2023-12-21 | Discharge: 2023-12-21 | Disposition: A | Discharge status: Home / Self Care | Source: Ambulatory Visit | Attending: Family Medicine | Admitting: Family Medicine

## 2023-12-21 ENCOUNTER — Encounter (HOSPITAL_BASED_OUTPATIENT_CLINIC_OR_DEPARTMENT_OTHER): Payer: Self-pay

## 2023-12-21 ENCOUNTER — Ambulatory Visit (HOSPITAL_BASED_OUTPATIENT_CLINIC_OR_DEPARTMENT_OTHER): Admission: EM | Admit: 2023-12-21 | Discharge: 2023-12-21 | Disposition: A | Discharge status: Home / Self Care

## 2023-12-21 VITALS — BP 146/81 | HR 84 | Temp 98.4°F | Resp 18

## 2023-12-21 DIAGNOSIS — R310 Gross hematuria: Secondary | ICD-10-CM | POA: Insufficient documentation

## 2023-12-21 DIAGNOSIS — N390 Urinary tract infection, site not specified: Secondary | ICD-10-CM | POA: Insufficient documentation

## 2023-12-21 DIAGNOSIS — Z87440 Personal history of urinary (tract) infections: Secondary | ICD-10-CM | POA: Diagnosis not present

## 2023-12-21 DIAGNOSIS — J029 Acute pharyngitis, unspecified: Secondary | ICD-10-CM | POA: Insufficient documentation

## 2023-12-21 DIAGNOSIS — R319 Hematuria, unspecified: Secondary | ICD-10-CM | POA: Diagnosis present

## 2023-12-21 LAB — POCT RAPID STREP A (OFFICE): Rapid Strep A Screen: NEGATIVE

## 2023-12-21 LAB — POCT URINE DIPSTICK
Glucose, UA: 250 mg/dL — AB
Nitrite, UA: POSITIVE — AB
Protein Ur, POC: >=300 mg/dL — AB
Spec Grav, UA: 1.02 (ref 1.010–1.025)
Urobilinogen, UA: 0.2 U/dL
pH, UA: 6 (ref 5.0–8.0)

## 2023-12-21 MED ORDER — CEFDINIR 300 MG PO CAPS: 300.0000 mg | ORAL_CAPSULE | Freq: Two times a day (BID) | ORAL | 0 refills | Status: AC

## 2023-12-21 MED ORDER — CEFTRIAXONE SODIUM 1 G IJ SOLR
1.0000 g | Freq: Once | INTRAMUSCULAR | Status: AC
  Administered 2023-12-21: 1 g via INTRAMUSCULAR

## 2023-12-21 NOTE — ED Triage Notes (Addendum)
 Peeing blood since this morning No burning, does have some discomfort, has back pain  Unable to get to the bathroom, does have Parkinson  Sore throat on the right side  Hurts to swallow,

## 2023-12-21 NOTE — Discharge Instructions (Addendum)
 Urinary tract infection with gross hematuria: Based on symptoms we will treat with ceftriaxone , 1000 mg injection now.  And then provide cefdinir , 300 mg, twice daily for 7 days.  Last renal function test available online was from 2023 but her EGFR was 74.  Patient's not aware of recent lab work.  Get plenty of fluids and rest.  Urinalysis was abnormal.  Urine culture sent.  Will adjust the plan of care, if needed once the culture results.  Sore throat: Rapid strep was negative.  Will be on antibiotics for her UTI and does not need a throat culture.  Follow-up with urology.  Call and get an appointment sooner than October 15 planned appointment.  Needs workup of the hematuria.
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
## 2023-12-22 LAB — URINE CULTURE: Culture: 40000 — AB

## 2023-12-23 ENCOUNTER — Ambulatory Visit: Payer: Medicare PPO | Admitting: Neurology

## 2023-12-25 ENCOUNTER — Ambulatory Visit (HOSPITAL_BASED_OUTPATIENT_CLINIC_OR_DEPARTMENT_OTHER): Payer: Self-pay | Admitting: Family Medicine

## 2023-12-25 NOTE — Progress Notes (Signed)
 Positive culture.  Should be responsive to cefdinir .  Unable to reach patient directly but able to leave a voicemail.  Encouraged to call or follow-up if needed.

## 2023-12-30 DIAGNOSIS — N390 Urinary tract infection, site not specified: Secondary | ICD-10-CM | POA: Diagnosis not present

## 2023-12-30 DIAGNOSIS — R3911 Hesitancy of micturition: Secondary | ICD-10-CM | POA: Diagnosis not present

## 2023-12-30 DIAGNOSIS — R310 Gross hematuria: Secondary | ICD-10-CM | POA: Diagnosis not present

## 2023-12-30 DIAGNOSIS — N952 Postmenopausal atrophic vaginitis: Secondary | ICD-10-CM | POA: Diagnosis not present

## 2023-12-30 DIAGNOSIS — N3941 Urge incontinence: Secondary | ICD-10-CM | POA: Diagnosis not present

## 2024-01-02 DIAGNOSIS — K5900 Constipation, unspecified: Secondary | ICD-10-CM | POA: Diagnosis not present

## 2024-01-02 DIAGNOSIS — R310 Gross hematuria: Secondary | ICD-10-CM | POA: Diagnosis not present

## 2024-01-02 DIAGNOSIS — N2889 Other specified disorders of kidney and ureter: Secondary | ICD-10-CM | POA: Diagnosis not present

## 2024-01-03 DIAGNOSIS — R310 Gross hematuria: Secondary | ICD-10-CM | POA: Diagnosis not present

## 2024-01-04 DIAGNOSIS — R310 Gross hematuria: Secondary | ICD-10-CM | POA: Diagnosis not present

## 2024-01-04 DIAGNOSIS — K5900 Constipation, unspecified: Secondary | ICD-10-CM | POA: Diagnosis not present

## 2024-01-08 DIAGNOSIS — N2889 Other specified disorders of kidney and ureter: Secondary | ICD-10-CM | POA: Diagnosis not present

## 2024-01-09 DIAGNOSIS — N2889 Other specified disorders of kidney and ureter: Secondary | ICD-10-CM | POA: Diagnosis not present

## 2024-01-14 ENCOUNTER — Ambulatory Visit (HOSPITAL_BASED_OUTPATIENT_CLINIC_OR_DEPARTMENT_OTHER)
Admission: EM | Admit: 2024-01-14 | Discharge: 2024-01-14 | Disposition: A | Discharge status: Home / Self Care | Attending: Family Medicine | Admitting: Family Medicine

## 2024-01-14 ENCOUNTER — Ambulatory Visit (HOSPITAL_BASED_OUTPATIENT_CLINIC_OR_DEPARTMENT_OTHER): Payer: Self-pay | Admitting: Family Medicine

## 2024-01-14 ENCOUNTER — Ambulatory Visit (INDEPENDENT_AMBULATORY_CARE_PROVIDER_SITE_OTHER): Admit: 2024-01-14 | Discharge: 2024-01-14 | Disposition: A | Discharge status: Home / Self Care | Admitting: Radiology

## 2024-01-14 ENCOUNTER — Encounter (HOSPITAL_BASED_OUTPATIENT_CLINIC_OR_DEPARTMENT_OTHER): Payer: Self-pay

## 2024-01-14 DIAGNOSIS — M1811 Unilateral primary osteoarthritis of first carpometacarpal joint, right hand: Secondary | ICD-10-CM | POA: Diagnosis not present

## 2024-01-14 DIAGNOSIS — S6991XA Unspecified injury of right wrist, hand and finger(s), initial encounter: Secondary | ICD-10-CM

## 2024-01-14 DIAGNOSIS — M79644 Pain in right finger(s): Secondary | ICD-10-CM

## 2024-01-14 DIAGNOSIS — Z043 Encounter for examination and observation following other accident: Secondary | ICD-10-CM | POA: Diagnosis not present

## 2024-01-14 NOTE — ED Triage Notes (Signed)
 Pt states she fell this morning and attempted to catch herself. Her right pinky finger is now swollen, bruised, and throbbing. Pt denies other injuries. She has hx of parkinson's disease. She has not taken anything for the pain.
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
## 2024-01-14 NOTE — Discharge Instructions (Signed)
 Your x-ray did not show any fractures.  I am placing you in a finger splint to stabilize the finger.  Most likely it is just sprain.  Wear the splint for the next week.  You can take it off to shower Ice the finger 3-4 times a day for 10 to 15 minutes at a time. Follow-up with orthopedic for continued issues

## 2024-01-22 DIAGNOSIS — W19XXXA Unspecified fall, initial encounter: Secondary | ICD-10-CM | POA: Diagnosis not present

## 2024-01-22 DIAGNOSIS — Z043 Encounter for examination and observation following other accident: Secondary | ICD-10-CM | POA: Diagnosis not present

## 2024-01-22 DIAGNOSIS — I10 Essential (primary) hypertension: Secondary | ICD-10-CM | POA: Diagnosis not present

## 2024-01-22 DIAGNOSIS — Z79899 Other long term (current) drug therapy: Secondary | ICD-10-CM | POA: Diagnosis not present

## 2024-01-22 DIAGNOSIS — Z23 Encounter for immunization: Secondary | ICD-10-CM | POA: Diagnosis not present

## 2024-01-22 DIAGNOSIS — G20A1 Parkinson's disease without dyskinesia, without mention of fluctuations: Secondary | ICD-10-CM | POA: Diagnosis not present

## 2024-01-22 DIAGNOSIS — S0191XA Laceration without foreign body of unspecified part of head, initial encounter: Secondary | ICD-10-CM | POA: Diagnosis not present

## 2024-01-22 DIAGNOSIS — K219 Gastro-esophageal reflux disease without esophagitis: Secondary | ICD-10-CM | POA: Diagnosis not present

## 2024-01-22 DIAGNOSIS — S0990XA Unspecified injury of head, initial encounter: Secondary | ICD-10-CM | POA: Diagnosis not present

## 2024-01-22 DIAGNOSIS — S0101XA Laceration without foreign body of scalp, initial encounter: Secondary | ICD-10-CM | POA: Diagnosis not present

## 2024-01-22 DIAGNOSIS — S065X9A Traumatic subdural hemorrhage with loss of consciousness of unspecified duration, initial encounter: Secondary | ICD-10-CM | POA: Diagnosis not present

## 2024-01-22 DIAGNOSIS — R918 Other nonspecific abnormal finding of lung field: Secondary | ICD-10-CM | POA: Diagnosis not present

## 2024-01-22 DIAGNOSIS — Z7902 Long term (current) use of antithrombotics/antiplatelets: Secondary | ICD-10-CM | POA: Diagnosis not present

## 2024-01-22 DIAGNOSIS — S020XXB Fracture of vault of skull, initial encounter for open fracture: Secondary | ICD-10-CM | POA: Diagnosis not present

## 2024-01-23 DIAGNOSIS — F32A Depression, unspecified: Secondary | ICD-10-CM | POA: Diagnosis not present

## 2024-01-23 DIAGNOSIS — S066X0D Traumatic subarachnoid hemorrhage without loss of consciousness, subsequent encounter: Secondary | ICD-10-CM | POA: Diagnosis not present

## 2024-01-23 DIAGNOSIS — T1490XA Injury, unspecified, initial encounter: Secondary | ICD-10-CM | POA: Diagnosis not present

## 2024-01-23 DIAGNOSIS — Z743 Need for continuous supervision: Secondary | ICD-10-CM | POA: Diagnosis not present

## 2024-01-23 DIAGNOSIS — S0211GA Other fracture of occiput, right side, initial encounter for closed fracture: Secondary | ICD-10-CM | POA: Diagnosis not present

## 2024-01-23 DIAGNOSIS — S3991XA Unspecified injury of abdomen, initial encounter: Secondary | ICD-10-CM | POA: Diagnosis not present

## 2024-01-23 DIAGNOSIS — M5459 Other low back pain: Secondary | ICD-10-CM | POA: Diagnosis not present

## 2024-01-23 DIAGNOSIS — S0101XA Laceration without foreign body of scalp, initial encounter: Secondary | ICD-10-CM | POA: Diagnosis not present

## 2024-01-23 DIAGNOSIS — F419 Anxiety disorder, unspecified: Secondary | ICD-10-CM | POA: Diagnosis not present

## 2024-01-23 DIAGNOSIS — R1310 Dysphagia, unspecified: Secondary | ICD-10-CM | POA: Diagnosis not present

## 2024-01-23 DIAGNOSIS — S065X1A Traumatic subdural hemorrhage with loss of consciousness of 30 minutes or less, initial encounter: Secondary | ICD-10-CM | POA: Diagnosis not present

## 2024-01-23 DIAGNOSIS — I10 Essential (primary) hypertension: Secondary | ICD-10-CM | POA: Diagnosis not present

## 2024-01-23 DIAGNOSIS — W01198A Fall on same level from slipping, tripping and stumbling with subsequent striking against other object, initial encounter: Secondary | ICD-10-CM | POA: Diagnosis not present

## 2024-01-23 DIAGNOSIS — M546 Pain in thoracic spine: Secondary | ICD-10-CM | POA: Diagnosis not present

## 2024-01-23 DIAGNOSIS — Z043 Encounter for examination and observation following other accident: Secondary | ICD-10-CM | POA: Diagnosis not present

## 2024-01-23 DIAGNOSIS — S065X0A Traumatic subdural hemorrhage without loss of consciousness, initial encounter: Secondary | ICD-10-CM | POA: Diagnosis not present

## 2024-01-23 DIAGNOSIS — S0990XA Unspecified injury of head, initial encounter: Secondary | ICD-10-CM | POA: Diagnosis not present

## 2024-01-23 DIAGNOSIS — S02119D Unspecified fracture of occiput, subsequent encounter for fracture with routine healing: Secondary | ICD-10-CM | POA: Diagnosis not present

## 2024-01-23 DIAGNOSIS — S299XXA Unspecified injury of thorax, initial encounter: Secondary | ICD-10-CM | POA: Diagnosis not present

## 2024-01-23 DIAGNOSIS — J9811 Atelectasis: Secondary | ICD-10-CM | POA: Diagnosis not present

## 2024-01-23 DIAGNOSIS — R531 Weakness: Secondary | ICD-10-CM | POA: Diagnosis not present

## 2024-01-23 DIAGNOSIS — E785 Hyperlipidemia, unspecified: Secondary | ICD-10-CM | POA: Diagnosis not present

## 2024-01-23 DIAGNOSIS — G20A1 Parkinson's disease without dyskinesia, without mention of fluctuations: Secondary | ICD-10-CM | POA: Diagnosis not present

## 2024-01-23 DIAGNOSIS — R402142 Coma scale, eyes open, spontaneous, at arrival to emergency department: Secondary | ICD-10-CM | POA: Diagnosis not present

## 2024-01-26 ENCOUNTER — Telehealth: Payer: Self-pay | Admitting: Oncology

## 2024-01-26 NOTE — Telephone Encounter (Signed)
 01/26/24 Patient appt cancelled.Patient is in [place] for Brain bleed.Daughter stated patient has been taken off her blood thinner meds.

## 2024-01-27 DIAGNOSIS — R519 Headache, unspecified: Secondary | ICD-10-CM | POA: Diagnosis not present

## 2024-01-27 DIAGNOSIS — S020XXA Fracture of vault of skull, initial encounter for closed fracture: Secondary | ICD-10-CM | POA: Diagnosis not present

## 2024-01-27 DIAGNOSIS — S066X0D Traumatic subarachnoid hemorrhage without loss of consciousness, subsequent encounter: Secondary | ICD-10-CM | POA: Diagnosis not present

## 2024-01-27 DIAGNOSIS — Z743 Need for continuous supervision: Secondary | ICD-10-CM | POA: Diagnosis not present

## 2024-01-27 DIAGNOSIS — M810 Age-related osteoporosis without current pathological fracture: Secondary | ICD-10-CM | POA: Diagnosis not present

## 2024-01-27 DIAGNOSIS — T1490XA Injury, unspecified, initial encounter: Secondary | ICD-10-CM | POA: Diagnosis not present

## 2024-01-27 DIAGNOSIS — S065X0A Traumatic subdural hemorrhage without loss of consciousness, initial encounter: Secondary | ICD-10-CM | POA: Diagnosis not present

## 2024-01-27 DIAGNOSIS — Z6830 Body mass index (BMI) 30.0-30.9, adult: Secondary | ICD-10-CM | POA: Diagnosis not present

## 2024-01-27 DIAGNOSIS — R262 Difficulty in walking, not elsewhere classified: Secondary | ICD-10-CM | POA: Diagnosis not present

## 2024-01-27 DIAGNOSIS — M79641 Pain in right hand: Secondary | ICD-10-CM | POA: Diagnosis not present

## 2024-01-27 DIAGNOSIS — F339 Major depressive disorder, recurrent, unspecified: Secondary | ICD-10-CM | POA: Diagnosis not present

## 2024-01-27 DIAGNOSIS — S02119D Unspecified fracture of occiput, subsequent encounter for fracture with routine healing: Secondary | ICD-10-CM | POA: Diagnosis not present

## 2024-01-27 DIAGNOSIS — R531 Weakness: Secondary | ICD-10-CM | POA: Diagnosis not present

## 2024-01-27 DIAGNOSIS — S62626A Displaced fracture of medial phalanx of right little finger, initial encounter for closed fracture: Secondary | ICD-10-CM | POA: Diagnosis not present

## 2024-01-27 DIAGNOSIS — S065X9D Traumatic subdural hemorrhage with loss of consciousness of unspecified duration, subsequent encounter: Secondary | ICD-10-CM | POA: Diagnosis not present

## 2024-01-28 ENCOUNTER — Inpatient Hospital Stay

## 2024-01-28 ENCOUNTER — Inpatient Hospital Stay: Admitting: Oncology

## 2024-01-29 ENCOUNTER — Ambulatory Visit: Admitting: Family Medicine

## 2024-01-30 DIAGNOSIS — R262 Difficulty in walking, not elsewhere classified: Secondary | ICD-10-CM | POA: Diagnosis not present

## 2024-01-30 DIAGNOSIS — M810 Age-related osteoporosis without current pathological fracture: Secondary | ICD-10-CM | POA: Diagnosis not present

## 2024-01-30 DIAGNOSIS — S065X9D Traumatic subdural hemorrhage with loss of consciousness of unspecified duration, subsequent encounter: Secondary | ICD-10-CM | POA: Diagnosis not present

## 2024-01-30 DIAGNOSIS — F339 Major depressive disorder, recurrent, unspecified: Secondary | ICD-10-CM | POA: Diagnosis not present

## 2024-02-02 ENCOUNTER — Other Ambulatory Visit: Payer: Self-pay | Admitting: Neurology

## 2024-02-02 DIAGNOSIS — G20B2 Parkinson's disease with dyskinesia, with fluctuations: Secondary | ICD-10-CM

## 2024-02-03 DIAGNOSIS — S065X0A Traumatic subdural hemorrhage without loss of consciousness, initial encounter: Secondary | ICD-10-CM | POA: Diagnosis not present

## 2024-02-03 DIAGNOSIS — Z6830 Body mass index (BMI) 30.0-30.9, adult: Secondary | ICD-10-CM | POA: Diagnosis not present

## 2024-02-03 DIAGNOSIS — S020XXA Fracture of vault of skull, initial encounter for closed fracture: Secondary | ICD-10-CM | POA: Diagnosis not present

## 2024-02-03 DIAGNOSIS — R519 Headache, unspecified: Secondary | ICD-10-CM | POA: Diagnosis not present

## 2024-02-03 DIAGNOSIS — M79641 Pain in right hand: Secondary | ICD-10-CM | POA: Diagnosis not present

## 2024-02-03 DIAGNOSIS — S62626A Displaced fracture of medial phalanx of right little finger, initial encounter for closed fracture: Secondary | ICD-10-CM | POA: Diagnosis not present

## 2024-02-10 DIAGNOSIS — F419 Anxiety disorder, unspecified: Secondary | ICD-10-CM | POA: Diagnosis not present

## 2024-02-10 DIAGNOSIS — M810 Age-related osteoporosis without current pathological fracture: Secondary | ICD-10-CM | POA: Diagnosis not present

## 2024-02-10 DIAGNOSIS — I10 Essential (primary) hypertension: Secondary | ICD-10-CM | POA: Diagnosis not present

## 2024-02-10 DIAGNOSIS — E440 Moderate protein-calorie malnutrition: Secondary | ICD-10-CM | POA: Diagnosis not present

## 2024-02-10 DIAGNOSIS — N390 Urinary tract infection, site not specified: Secondary | ICD-10-CM | POA: Diagnosis not present

## 2024-02-10 DIAGNOSIS — S02119D Unspecified fracture of occiput, subsequent encounter for fracture with routine healing: Secondary | ICD-10-CM | POA: Diagnosis not present

## 2024-02-10 DIAGNOSIS — G20A1 Parkinson's disease without dyskinesia, without mention of fluctuations: Secondary | ICD-10-CM | POA: Diagnosis not present

## 2024-02-10 DIAGNOSIS — M4800 Spinal stenosis, site unspecified: Secondary | ICD-10-CM | POA: Diagnosis not present

## 2024-02-10 DIAGNOSIS — F32A Depression, unspecified: Secondary | ICD-10-CM | POA: Diagnosis not present

## 2024-02-10 DIAGNOSIS — S065X0D Traumatic subdural hemorrhage without loss of consciousness, subsequent encounter: Secondary | ICD-10-CM | POA: Diagnosis not present

## 2024-02-10 DIAGNOSIS — N952 Postmenopausal atrophic vaginitis: Secondary | ICD-10-CM | POA: Diagnosis not present

## 2024-02-10 DIAGNOSIS — R310 Gross hematuria: Secondary | ICD-10-CM | POA: Diagnosis not present

## 2024-02-11 DIAGNOSIS — M79644 Pain in right finger(s): Secondary | ICD-10-CM | POA: Diagnosis not present

## 2024-02-12 DIAGNOSIS — I10 Essential (primary) hypertension: Secondary | ICD-10-CM | POA: Diagnosis not present

## 2024-02-12 DIAGNOSIS — S065X0D Traumatic subdural hemorrhage without loss of consciousness, subsequent encounter: Secondary | ICD-10-CM | POA: Diagnosis not present

## 2024-02-12 DIAGNOSIS — S02119D Unspecified fracture of occiput, subsequent encounter for fracture with routine healing: Secondary | ICD-10-CM | POA: Diagnosis not present

## 2024-02-12 DIAGNOSIS — G20A1 Parkinson's disease without dyskinesia, without mention of fluctuations: Secondary | ICD-10-CM | POA: Diagnosis not present

## 2024-02-12 DIAGNOSIS — E440 Moderate protein-calorie malnutrition: Secondary | ICD-10-CM | POA: Diagnosis not present

## 2024-02-12 DIAGNOSIS — F419 Anxiety disorder, unspecified: Secondary | ICD-10-CM | POA: Diagnosis not present

## 2024-02-12 DIAGNOSIS — M4800 Spinal stenosis, site unspecified: Secondary | ICD-10-CM | POA: Diagnosis not present

## 2024-02-12 DIAGNOSIS — F32A Depression, unspecified: Secondary | ICD-10-CM | POA: Diagnosis not present

## 2024-02-12 DIAGNOSIS — M810 Age-related osteoporosis without current pathological fracture: Secondary | ICD-10-CM | POA: Diagnosis not present

## 2024-02-17 DIAGNOSIS — S065X0D Traumatic subdural hemorrhage without loss of consciousness, subsequent encounter: Secondary | ICD-10-CM | POA: Diagnosis not present
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
## 2024-02-19 ENCOUNTER — Inpatient Hospital Stay

## 2024-02-19 ENCOUNTER — Other Ambulatory Visit: Payer: Self-pay | Admitting: Oncology

## 2024-02-19 ENCOUNTER — Telehealth: Payer: Self-pay | Admitting: Oncology

## 2024-02-19 ENCOUNTER — Inpatient Hospital Stay: Attending: Oncology | Admitting: Oncology

## 2024-02-19 VITALS — BP 125/59 | HR 60 | Temp 98.0°F | Resp 14 | Ht 61.0 in | Wt 163.0 lb

## 2024-02-19 DIAGNOSIS — D509 Iron deficiency anemia, unspecified: Secondary | ICD-10-CM | POA: Diagnosis not present

## 2024-02-19 DIAGNOSIS — D508 Other iron deficiency anemias: Secondary | ICD-10-CM

## 2024-02-19 LAB — CBC WITH DIFFERENTIAL (CANCER CENTER ONLY)
Abs Immature Granulocytes: 0.02 K/uL (ref 0.00–0.07)
Basophils Absolute: 0 K/uL (ref 0.0–0.1)
Basophils Relative: 1 %
Eosinophils Absolute: 0.2 K/uL (ref 0.0–0.5)
Eosinophils Relative: 3 %
HCT: 39.8 % (ref 36.0–46.0)
Hemoglobin: 13.1 g/dL (ref 12.0–15.0)
Immature Granulocytes: 0 %
Lymphocytes Relative: 25 %
Lymphs Abs: 1.9 K/uL (ref 0.7–4.0)
MCH: 28.5 pg (ref 26.0–34.0)
MCHC: 32.9 g/dL (ref 30.0–36.0)
MCV: 86.7 fL (ref 80.0–100.0)
Monocytes Absolute: 0.5 K/uL (ref 0.1–1.0)
Monocytes Relative: 6 %
Neutro Abs: 5 K/uL (ref 1.7–7.7)
Neutrophils Relative %: 65 %
Platelet Count: 162 K/uL (ref 150–400)
RBC: 4.59 MIL/uL (ref 3.87–5.11)
RDW: 17.4 % — ABNORMAL HIGH (ref 11.5–15.5)
WBC Count: 7.6 K/uL (ref 4.0–10.5)
nRBC: 0 % (ref 0.0–0.2)

## 2024-02-19 LAB — IRON AND TIBC
Iron: 58 ug/dL (ref 28–170)
Saturation Ratios: 18 % (ref 10.4–31.8)
TIBC: 318 ug/dL (ref 250–450)
UIBC: 260 ug/dL

## 2024-02-19 LAB — FERRITIN: Ferritin: 253 ng/mL (ref 11–307)

## 2024-02-19 NOTE — Telephone Encounter (Signed)
 Patient has been scheduled for follow-up visit per 02/19/24 LOS.  Pt given an appt calendar with date and time.

## 2024-02-23 DIAGNOSIS — M4800 Spinal stenosis, site unspecified: Secondary | ICD-10-CM | POA: Diagnosis not present

## 2024-02-23 DIAGNOSIS — F32A Depression, unspecified: Secondary | ICD-10-CM | POA: Diagnosis not present

## 2024-02-23 DIAGNOSIS — E440 Moderate protein-calorie malnutrition: Secondary | ICD-10-CM | POA: Diagnosis not present

## 2024-02-23 DIAGNOSIS — I10 Essential (primary) hypertension: Secondary | ICD-10-CM | POA: Diagnosis not present

## 2024-02-23 DIAGNOSIS — M810 Age-related osteoporosis without current pathological fracture: Secondary | ICD-10-CM | POA: Diagnosis not present

## 2024-02-23 DIAGNOSIS — G20A1 Parkinson's disease without dyskinesia, without mention of fluctuations: Secondary | ICD-10-CM | POA: Diagnosis not present

## 2024-02-23 DIAGNOSIS — F419 Anxiety disorder, unspecified: Secondary | ICD-10-CM | POA: Diagnosis not present

## 2024-02-23 DIAGNOSIS — S02119D Unspecified fracture of occiput, subsequent encounter for fracture with routine healing: Secondary | ICD-10-CM | POA: Diagnosis not present

## 2024-02-23 DIAGNOSIS — S065X0D Traumatic subdural hemorrhage without loss of consciousness, subsequent encounter: Secondary | ICD-10-CM | POA: Diagnosis not present

## 2024-02-24 ENCOUNTER — Ambulatory Visit: Admitting: Family Medicine

## 2024-02-27 DIAGNOSIS — S065XAA Traumatic subdural hemorrhage with loss of consciousness status unknown, initial encounter: Secondary | ICD-10-CM | POA: Diagnosis not present

## 2024-02-27 DIAGNOSIS — S02113A Unspecified occipital condyle fracture, initial encounter for closed fracture: Secondary | ICD-10-CM | POA: Diagnosis not present

## 2024-03-01 ENCOUNTER — Encounter: Payer: Self-pay | Admitting: Neurology

## 2024-03-04 DIAGNOSIS — S02119D Unspecified fracture of occiput, subsequent encounter for fracture with routine healing: Secondary | ICD-10-CM | POA: Diagnosis not present

## 2024-03-04 DIAGNOSIS — M4800 Spinal stenosis, site unspecified: Secondary | ICD-10-CM | POA: Diagnosis not present

## 2024-03-04 DIAGNOSIS — I10 Essential (primary) hypertension: Secondary | ICD-10-CM | POA: Diagnosis not present

## 2024-03-04 DIAGNOSIS — M810 Age-related osteoporosis without current pathological fracture: Secondary | ICD-10-CM | POA: Diagnosis not present

## 2024-03-04 DIAGNOSIS — G20A1 Parkinson's disease without dyskinesia, without mention of fluctuations: Secondary | ICD-10-CM | POA: Diagnosis not present

## 2024-03-04 DIAGNOSIS — S065X0D Traumatic subdural hemorrhage without loss of consciousness, subsequent encounter: Secondary | ICD-10-CM | POA: Diagnosis not present

## 2024-03-04 DIAGNOSIS — F419 Anxiety disorder, unspecified: Secondary | ICD-10-CM | POA: Diagnosis not present

## 2024-03-04 DIAGNOSIS — F32A Depression, unspecified: Secondary | ICD-10-CM | POA: Diagnosis not present

## 2024-03-09 DIAGNOSIS — S065X0D Traumatic subdural hemorrhage without loss of consciousness, subsequent encounter: Secondary | ICD-10-CM | POA: Diagnosis not present

## 2024-03-09 DIAGNOSIS — F419 Anxiety disorder, unspecified: Secondary | ICD-10-CM | POA: Diagnosis not present

## 2024-03-09 DIAGNOSIS — E440 Moderate protein-calorie malnutrition: Secondary | ICD-10-CM | POA: Diagnosis not present

## 2024-03-09 DIAGNOSIS — I10 Essential (primary) hypertension: Secondary | ICD-10-CM | POA: Diagnosis not present

## 2024-03-09 DIAGNOSIS — S02119D Unspecified fracture of occiput, subsequent encounter for fracture with routine healing: Secondary | ICD-10-CM | POA: Diagnosis not present

## 2024-03-09 DIAGNOSIS — G20A1 Parkinson's disease without dyskinesia, without mention of fluctuations: Secondary | ICD-10-CM | POA: Diagnosis not present

## 2024-03-09 DIAGNOSIS — M4800 Spinal stenosis, site unspecified: Secondary | ICD-10-CM | POA: Diagnosis not present

## 2024-03-09 DIAGNOSIS — M810 Age-related osteoporosis without current pathological fracture: Secondary | ICD-10-CM | POA: Diagnosis not present

## 2024-03-09 DIAGNOSIS — F32A Depression, unspecified: Secondary | ICD-10-CM | POA: Diagnosis not present

## 2024-03-16 DIAGNOSIS — Z1231 Encounter for screening mammogram for malignant neoplasm of breast: Secondary | ICD-10-CM | POA: Diagnosis not present

## 2024-03-16 DIAGNOSIS — Z961 Presence of intraocular lens: Secondary | ICD-10-CM | POA: Diagnosis not present

## 2024-03-16 DIAGNOSIS — H524 Presbyopia: Secondary | ICD-10-CM | POA: Diagnosis not present

## 2024-03-16 DIAGNOSIS — H04123 Dry eye syndrome of bilateral lacrimal glands: Secondary | ICD-10-CM | POA: Diagnosis not present

## 2024-03-16 DIAGNOSIS — H40013 Open angle with borderline findings, low risk, bilateral: Secondary | ICD-10-CM | POA: Diagnosis not present

## 2024-03-16 DIAGNOSIS — H52223 Regular astigmatism, bilateral: Secondary | ICD-10-CM | POA: Diagnosis not present

## 2024-03-16 DIAGNOSIS — H5211 Myopia, right eye: Secondary | ICD-10-CM | POA: Diagnosis not present

## 2024-03-16 NOTE — Progress Notes (Unsigned)
   LILLETTE Ileana Collet, PhD, LAT, ATC acting as a scribe for Artist Lloyd, MD.  Barbara Miranda is a 73 y.o. female who presents to [facility] today for osteoporosis management.  DEXA scan (date, T-score): 04/16/23: Spine= , L-FN= , R-FN= Prior treatment: *** History of Hip, Spine, or Wrist Fx: *** Heart disease or stroke: *** Cancer: *** Kidney Disease: *** Gastric/Peptic Ulcer: *** Gastric bypass surgery: *** Severe GERD: *** Hx of seizures: *** Age at Menopause: *** Calcium intake: *** Vitamin D intake: *** Hormone replacement therapy: *** Smoking history: *** Alcohol: *** Exercise: *** Major dental work in past year: *** Parents with hip/spine fracture: *** Height loss: ***   Pertinent review of systems: ***  Relevant historical information: ***   Exam:  There were no vitals taken for this visit. General: Well Developed, well nourished, and in no acute distress.   MSK: ***    Lab and Radiology Results No results found for this or any previous visit (from the past 72 hours). No results found.     Assessment and Plan: 73 y.o. female with ***   PDMP not reviewed this encounter. No orders of the defined types were placed in this encounter.  No orders of the defined types were placed in this encounter.    Discussed warning signs or symptoms. Please see discharge instructions. Patient expresses understanding.   ***

## 2024-03-17 ENCOUNTER — Ambulatory Visit (INDEPENDENT_AMBULATORY_CARE_PROVIDER_SITE_OTHER): Admitting: Family Medicine

## 2024-03-17 VITALS — BP 130/86 | HR 70 | Ht 61.0 in | Wt 163.0 lb

## 2024-03-17 DIAGNOSIS — G20B1 Parkinson's disease with dyskinesia, without mention of fluctuations: Secondary | ICD-10-CM | POA: Diagnosis not present

## 2024-03-17 DIAGNOSIS — M810 Age-related osteoporosis without current pathological fracture: Secondary | ICD-10-CM | POA: Diagnosis not present

## 2024-03-17 LAB — COMPREHENSIVE METABOLIC PANEL WITH GFR
ALT: 2 U/L (ref 0–35)
AST: 12 U/L (ref 0–37)
Albumin: 4 g/dL (ref 3.5–5.2)
Alkaline Phosphatase: 97 U/L (ref 39–117)
BUN: 16 mg/dL (ref 6–23)
CO2: 27 meq/L (ref 19–32)
Calcium: 9.5 mg/dL (ref 8.4–10.5)
Chloride: 104 meq/L (ref 96–112)
Creatinine, Ser: 0.76 mg/dL (ref 0.40–1.20)
GFR: 78.38 mL/min (ref >60.00)
Glucose, Bld: 104 mg/dL — ABNORMAL HIGH (ref 70–99)
Potassium: 3.9 meq/L (ref 3.5–5.1)
Sodium: 139 meq/L (ref 135–145)
Total Bilirubin: 0.5 mg/dL (ref 0.2–1.2)
Total Protein: 7.2 g/dL (ref 6.0–8.3)

## 2024-03-17 LAB — VITAMIN D 25 HYDROXY (VIT D DEFICIENCY, FRACTURES): VITD: 37.14 ng/mL (ref 30.00–100.00)

## 2024-03-17 NOTE — Patient Instructions (Addendum)
 Thank you for coming in today.   Please get labs today before you leave   Consider Reclast infusion once a year  Consider Fosamax once a wk  Consider [employer] and working with a systems analyst

## 2024-03-18 ENCOUNTER — Ambulatory Visit: Payer: Self-pay | Admitting: Family Medicine

## 2024-03-18 DIAGNOSIS — S62626D Displaced fracture of medial phalanx of right little finger, subsequent encounter for fracture with routine healing: Secondary | ICD-10-CM | POA: Diagnosis not present

## 2024-03-18 NOTE — Progress Notes (Signed)
 Labs look okay.  Vitamin D  level is okay.  Please continue to take vitamin D  over-the-counter.

## 2024-03-31 DIAGNOSIS — K219 Gastro-esophageal reflux disease without esophagitis: Secondary | ICD-10-CM | POA: Diagnosis not present

## 2024-04-02 DIAGNOSIS — N390 Urinary tract infection, site not specified: Secondary | ICD-10-CM | POA: Diagnosis not present
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
## 2024-05-05 ENCOUNTER — Encounter: Payer: Self-pay | Admitting: Neurology

## 2024-05-05 ENCOUNTER — Ambulatory Visit: Admitting: Neurology

## 2024-05-05 VITALS — BP 124/82 | HR 70 | Wt 162.6 lb

## 2024-05-05 DIAGNOSIS — F330 Major depressive disorder, recurrent, mild: Secondary | ICD-10-CM

## 2024-05-05 DIAGNOSIS — G5621 Lesion of ulnar nerve, right upper limb: Secondary | ICD-10-CM | POA: Diagnosis not present

## 2024-05-05 DIAGNOSIS — G20B2 Parkinson's disease with dyskinesia, with fluctuations: Secondary | ICD-10-CM

## 2024-05-05 MED ORDER — CARBIDOPA-LEVODOPA ER 50-200 MG PO TBCR: 1.0000 | EXTENDED_RELEASE_TABLET | Freq: Every day | ORAL | 1 refills | Status: AC

## 2024-05-05 NOTE — Patient Instructions (Addendum)
 Add carbidopa /levodopa  CR 50/200 at bedtime for nighttime tremor Use walker at all times!  The physicians and staff at [facility] are committed to providing excellent care. You may receive a survey requesting feedback about your experience at our office. We strive to receive very good responses to the survey questions. If you feel that your experience would prevent you from giving the office a very good  response, please contact our office to try to remedy the situation. We may be reached at [phone]. Thank you for taking the time out of your busy day to complete the survey.

## 2024-06-23 ENCOUNTER — Telehealth: Admitting: Neurology

## 2024-08-19 ENCOUNTER — Inpatient Hospital Stay: Admitting: Oncology

## 2024-08-19 ENCOUNTER — Inpatient Hospital Stay

## 2024-11-02 ENCOUNTER — Ambulatory Visit: Payer: Self-pay | Admitting: Neurology
